# Patient Record
Sex: Male | Born: 1965 | Race: Asian | Hispanic: No | Marital: Married | Smoking: Former smoker
Health system: Southern US, Community
[De-identification: ages and names within clinical notes are randomized; demographics above are authoritative.]

## PROBLEM LIST (undated history)

## (undated) DIAGNOSIS — I739 Peripheral vascular disease, unspecified: Secondary | ICD-10-CM

## (undated) HISTORY — PX: OTHER SURGICAL HISTORY: SHX169

## (undated) HISTORY — DX: Peripheral vascular disease, unspecified: I73.9

---

## 2004-08-24 ENCOUNTER — Encounter: Admission: RE | Admit: 2004-08-24 | Discharge: 2004-08-24 | Payer: Self-pay | Admitting: Family Medicine

## 2004-12-02 ENCOUNTER — Encounter: Admission: RE | Admit: 2004-12-02 | Discharge: 2004-12-02 | Payer: Self-pay | Admitting: Family Medicine

## 2005-08-25 ENCOUNTER — Encounter: Admission: RE | Admit: 2005-08-25 | Discharge: 2005-08-25 | Payer: Self-pay | Admitting: Family Medicine

## 2005-10-19 ENCOUNTER — Encounter: Admission: RE | Admit: 2005-10-19 | Discharge: 2005-10-19 | Payer: Self-pay | Admitting: Interventional Radiology

## 2005-10-25 ENCOUNTER — Encounter: Admission: RE | Admit: 2005-10-25 | Discharge: 2005-10-25 | Payer: Self-pay | Admitting: Interventional Radiology

## 2005-11-29 ENCOUNTER — Encounter: Admission: RE | Admit: 2005-11-29 | Discharge: 2005-11-29 | Payer: Self-pay | Admitting: Interventional Radiology

## 2006-01-19 ENCOUNTER — Encounter: Admission: RE | Admit: 2006-01-19 | Discharge: 2006-01-19 | Payer: Self-pay | Admitting: Interventional Radiology

## 2006-01-24 HISTORY — PX: VARICOSE VEIN SURGERY: SHX832

## 2006-01-25 ENCOUNTER — Encounter: Admission: RE | Admit: 2006-01-25 | Discharge: 2006-01-25 | Payer: Self-pay | Admitting: Interventional Radiology

## 2006-02-23 ENCOUNTER — Encounter: Admission: RE | Admit: 2006-02-23 | Discharge: 2006-02-23 | Payer: Self-pay | Admitting: Interventional Radiology

## 2006-04-06 ENCOUNTER — Encounter: Admission: RE | Admit: 2006-04-06 | Discharge: 2006-04-06 | Payer: Self-pay | Admitting: Interventional Radiology

## 2006-07-12 IMAGING — US US MD EVAL AND MANAGEMENT - NRPT [facility]
1 series · 12 of 16 positions shown · non-contrast
Comparison: none

[Series 1: unknown · 12 of 41 slices shown]
[im 1/41]
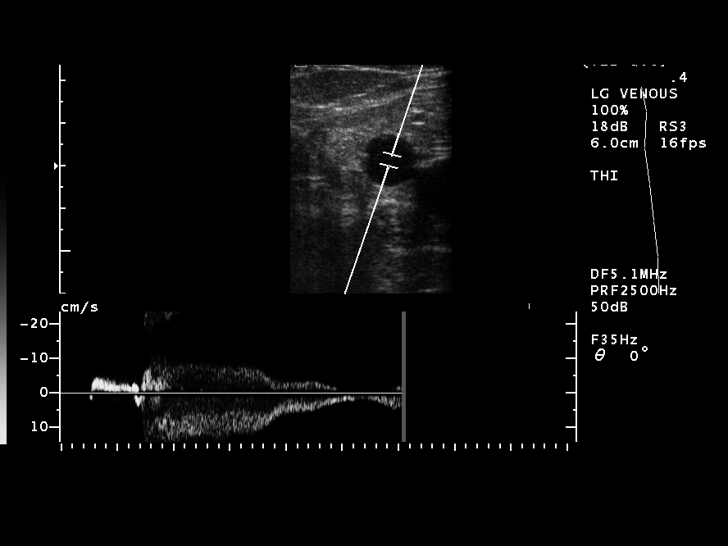
[im 6/41]
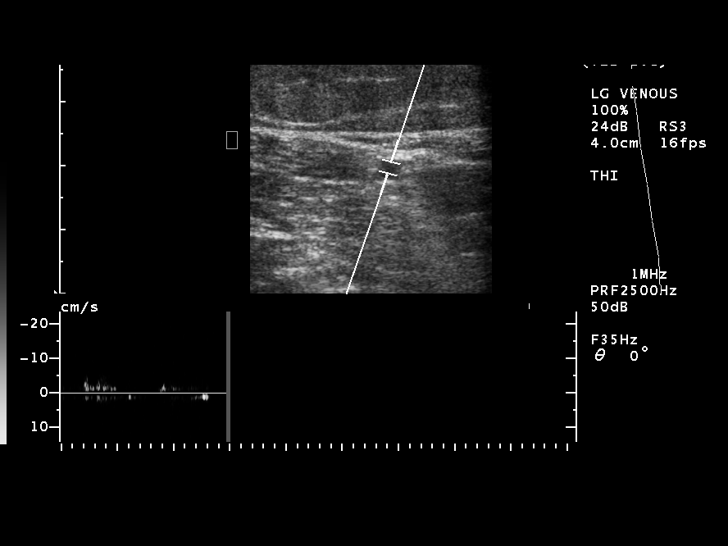
[im 9/41]
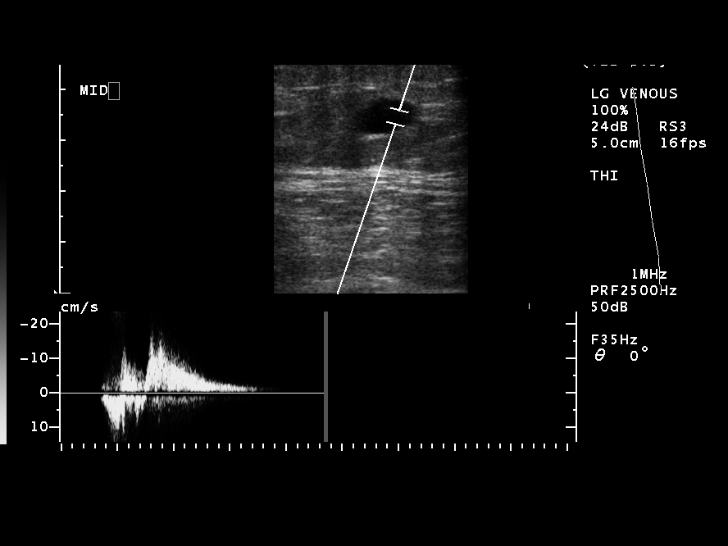
[im 11/41]
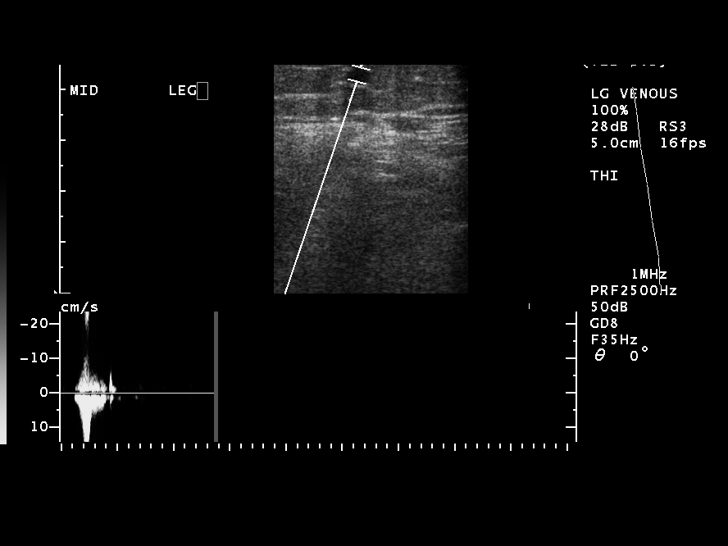
[im 17/41]
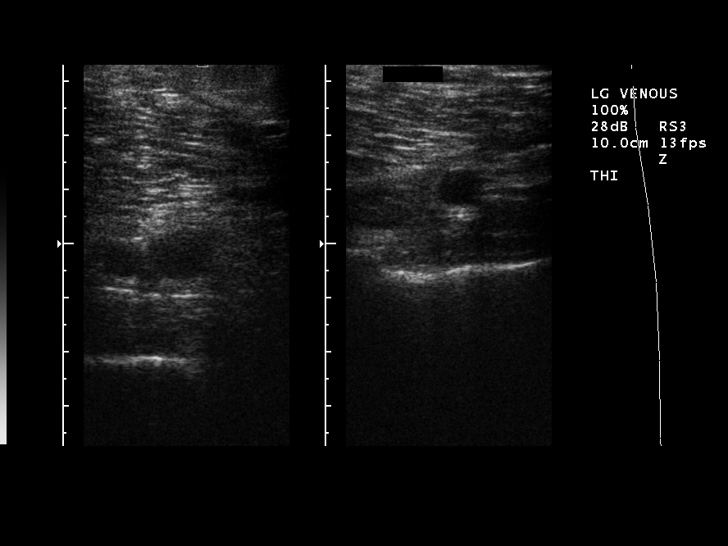
[im 19/41]
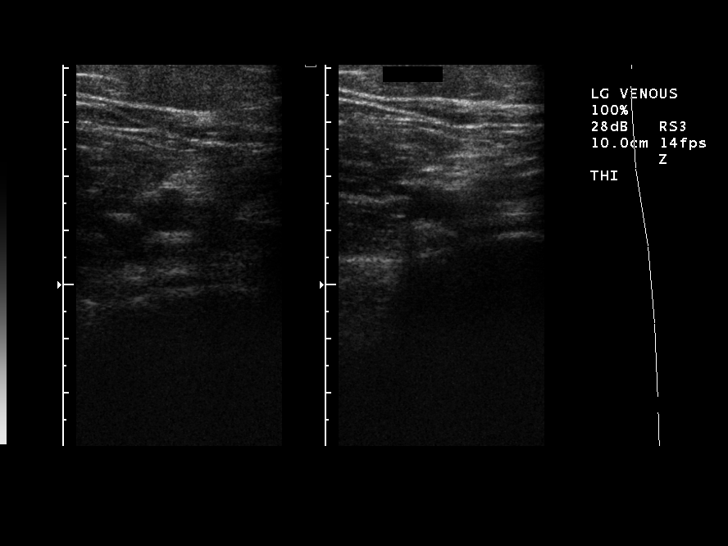
[im 22/41]
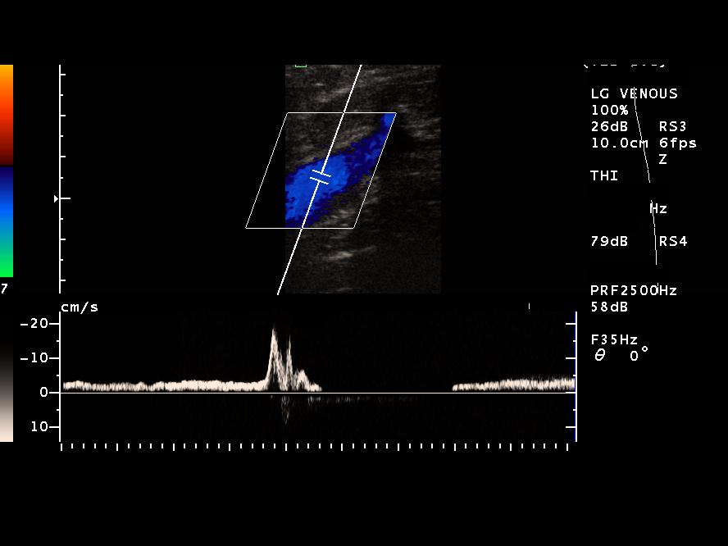
[im 27/41]
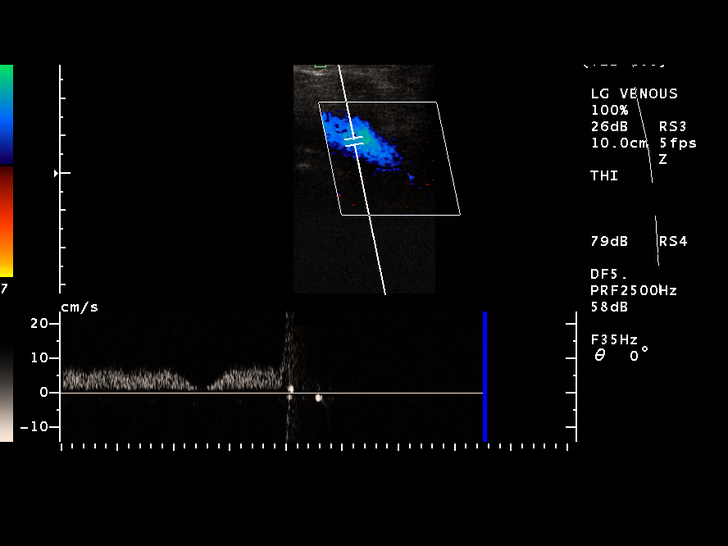
[im 30/41]
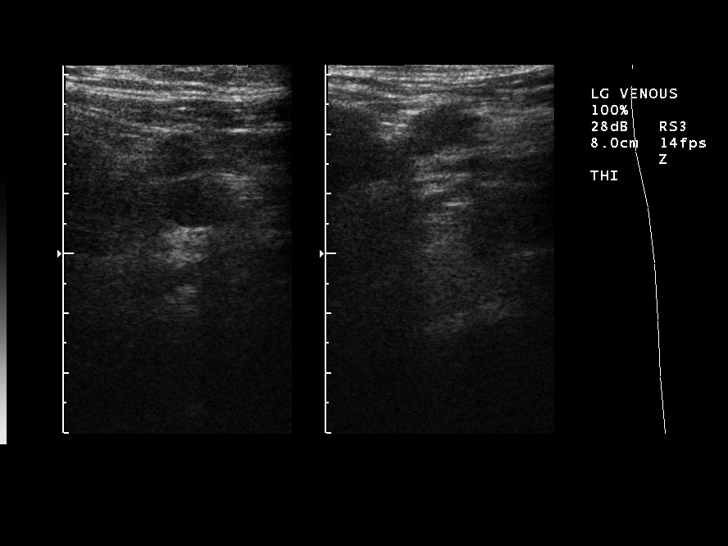
[im 33/41]
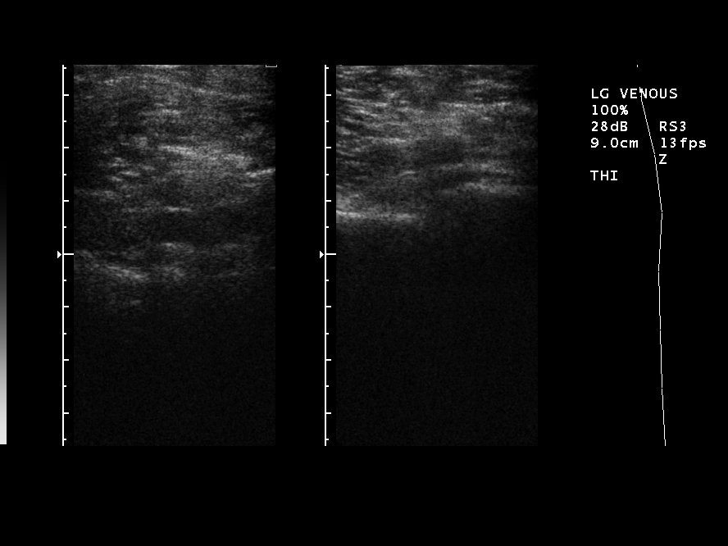
[im 38/41]
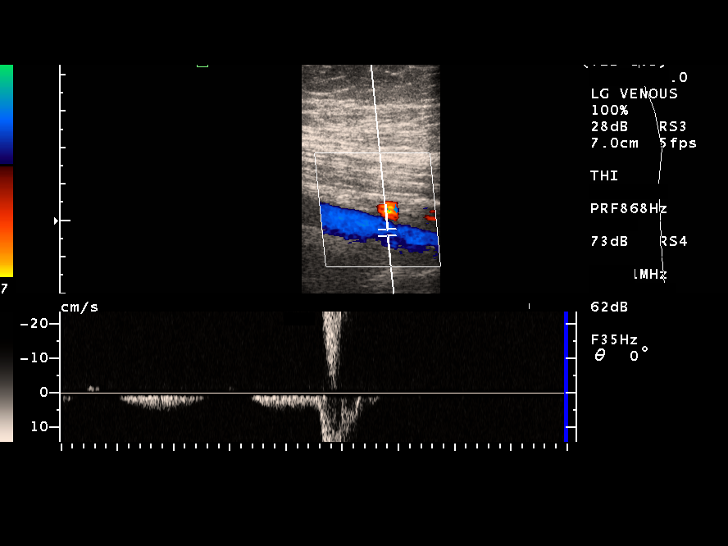
[im 41/41]
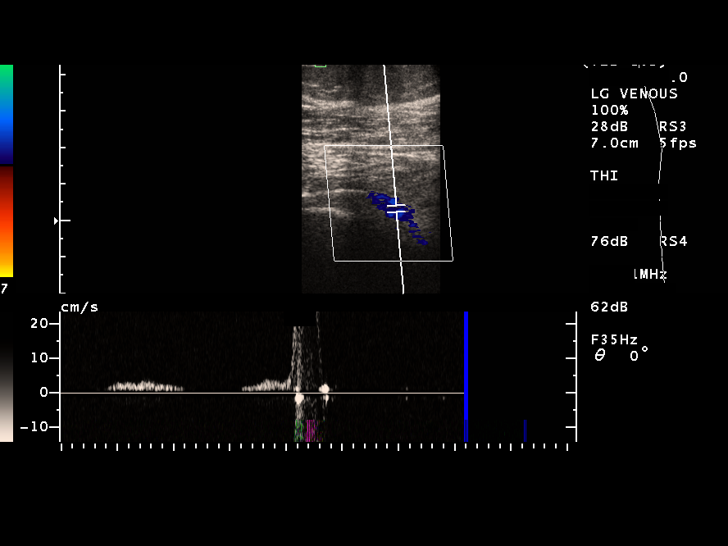

[12 of 16 positions shown; findings below may reference images not displayed]

OUTPATIENT CONSULTATION ? LEVEL III ? [DATE]
 Mauricia Jalali, M.D.
 [REDACTED] [REDACTED]
 [address]
 [HOSPITAL], [REDACTED].  [id number]
 RE:  Sravani Mohler (DOB ? [birth year])
 Dear Dr. Aujla:
 Thank you for referring Mr. Polak for varicose vein evaluation.  Review of his vein history was performed.  This is a pleasant 38 year old gentleman, who has fairly extensive lower extremity varicose veins, worse on the right.  He has had no prior treatments for varicose or spider veins.  He does have a family history of varicose veins which affected all of his siblings including one brother and two sisters.  There is no history of DVT or active thrombophlebitis.  The patient does report episodes of spontaneous bleeding from the varicose veins about the right ankle.  He also has had episodes of skin ulceration requiring treatment about the right upper ankle and calf region.  He currently does not wear prescription strength graded compression stockings.  He currently owns a gas station and works 10 to 12 hours a day.  He does stand for long periods of time during the day.  He describes a burning and throbbing type pain with achiness over the varicose veins involving both lower extremities.  He also has extensive pruritus over the varicosities with transient paresthesias and tingling sensations.  Elevation does partially alleviate his symptoms at the end of the day.  He also describes restless leg syndrome with difficulty falling asleep at night because of his lower extremity symptoms.  Despite his fairly extensive and advanced venous disease, he occasionally requires Tylenol or anti-inflammatories for pain relief. 
 On physical exam, the patient has fairly extensive right thigh and calf medial varicose veins which are large and tortuous. The veins are compressible.  No active thrombophlebitis.  There is +1 - 2 ankle edema.  Early hyperpigmentation and redness is noted of the anterior tibial region.  The ankles have ectatic reticular and spider veins diffusely extending onto the dorsum of the foot.  The left lower extremity has a large tortuous dominant posteromedial calf varicosity with +1 edema.  He has ankle spider and reticular veins which are prominent.  There are no thigh varicosities on the left extremity. 
 Ultrasound was performed of the lower extremities today.  This was dictated as a separate report. Briefly, the patient has extensive bilateral GSV venous insufficiency/reflux with incompetent saphenofemoral junctions. The small saphenous veins are normal.  There is no evidence of DVT.  
 The patient has a normal peripheral pulse exam with palpable +2 DP and PT pulses.
 Today, I spent approximately 45 minutes with Mr. Polak and in detail described the physical exam and ultrasound findings.  Our discussion centered around staged bilateral endovenous laser treatment of the abnormal great saphenous veins resulting in his symptomatic large painful varicosities.  The goals with treating his legs would be to prevent progression of disease and decompress the varicose veins to eliminate his symptoms of burning and throbbing pain, pruritus, and paresthesias.  Also, he has already had one episode of skin break down with ulceration and one episode of spontaneous bleeding.  
 Today, he was fitted for 20 to 30 mm Hg prescription strength graded compression stockings, thigh length and encouraged to begin an exercise program for weight loss.  He does wish to proceed with insurance preauthorization which we will perform for him.  
 Again, thank you for allowing me to participate in Mr. Polak?Dammert care.  I will keep you informed of his treatment plan and progress.  
 Lala, Msw, M.D.
 MTS:chc

## 2006-07-25 ENCOUNTER — Encounter: Admission: RE | Admit: 2006-07-25 | Discharge: 2006-07-25 | Payer: Self-pay | Admitting: Interventional Radiology

## 2007-12-13 IMAGING — US US EXTREM LOW VENOUS*L*
1 series · 14 of 24 positions shown · non-contrast
Comparison: none

CLINICAL DATA: 40-year-old male, 1 week status-post left GSV transcatheter laser occlusion and left calf varicose vein phlebectomy. 
LEFT LOWER EXTREMITY VENOUS DOPPLER ULTRASOUND:
TECHNIQUE: Gray-scale sonography with compression, as well as color and duplex Doppler ultrasound, were performed to evaluate the deep venous system from the level of the common femoral vein through the popliteal and proximal calf veins.

[Series 1: unknown · 14 of 31 slices shown]
[im 1/31]
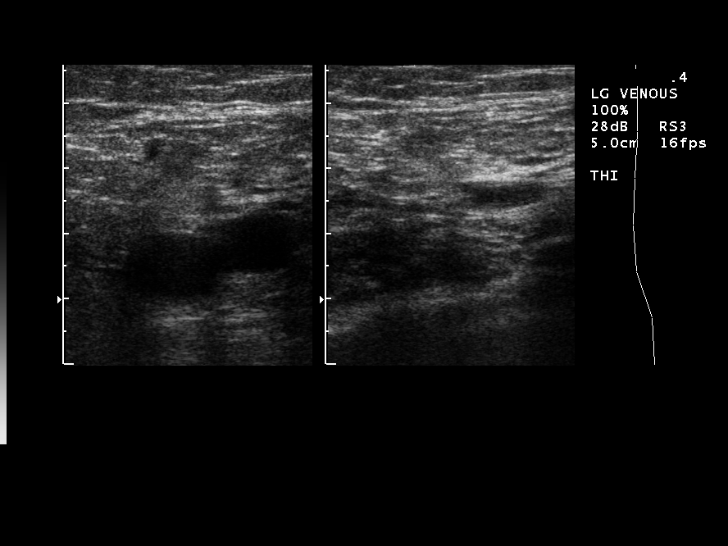
[im 3/31]
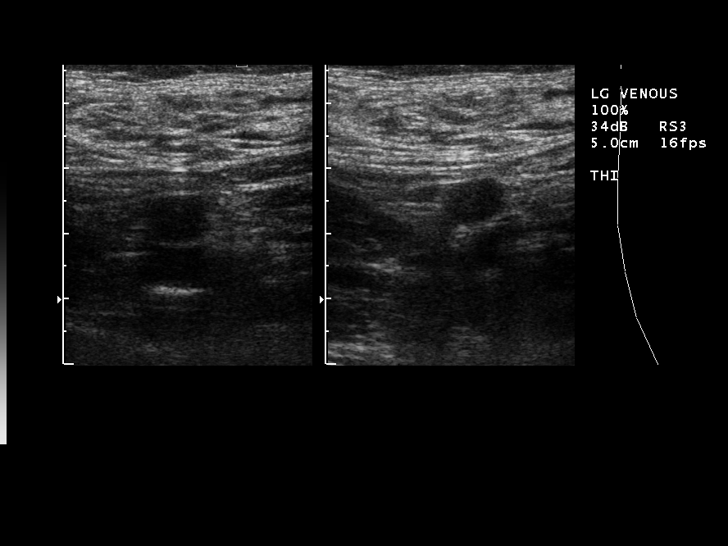
[im 6/31]
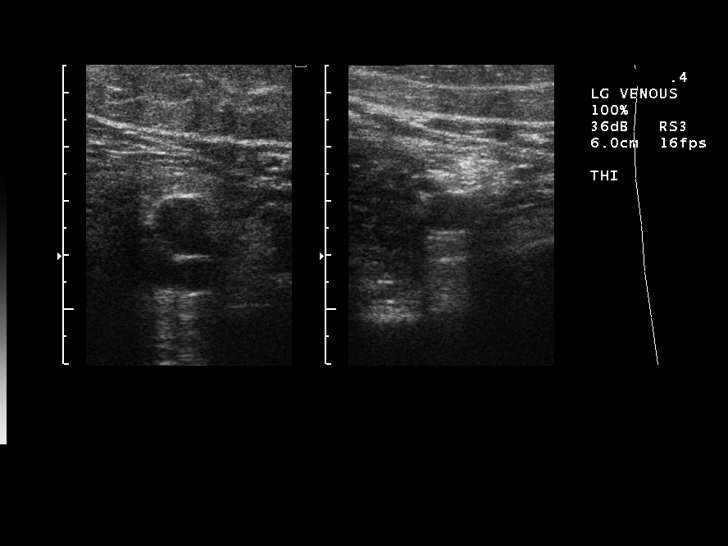
[im 8/31]
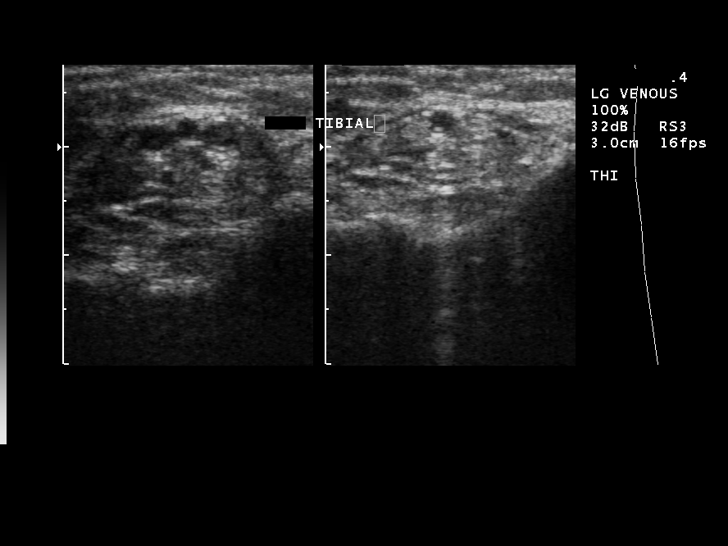
[im 10/31]
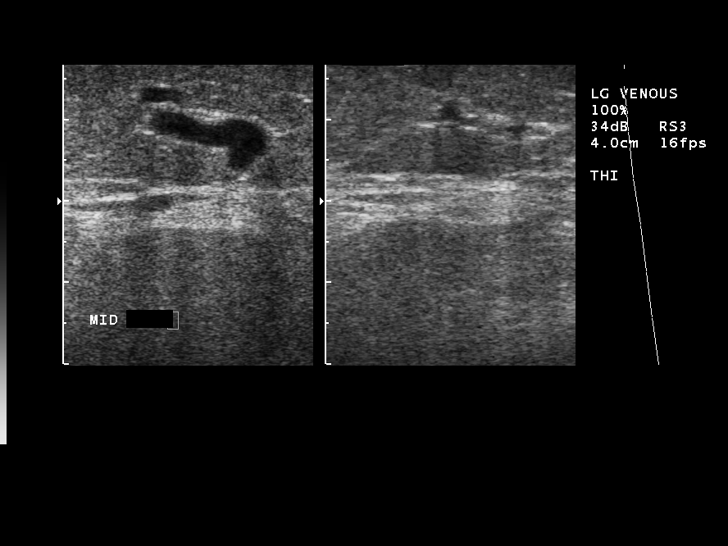
[im 12/31]
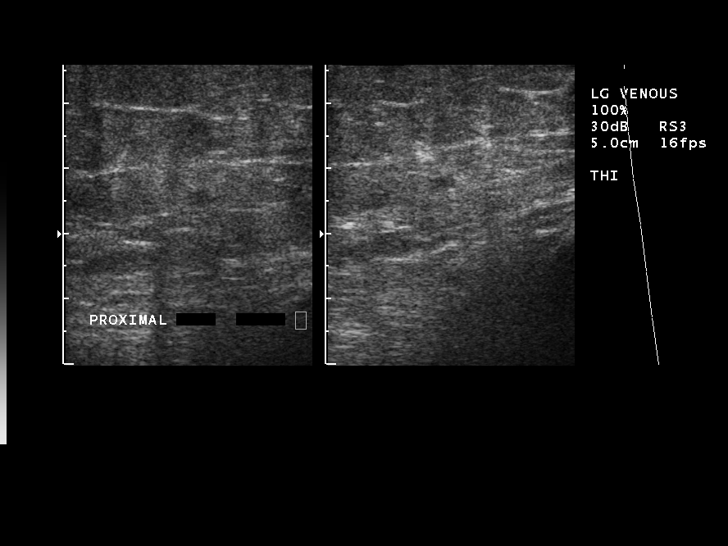
[im 15/31]
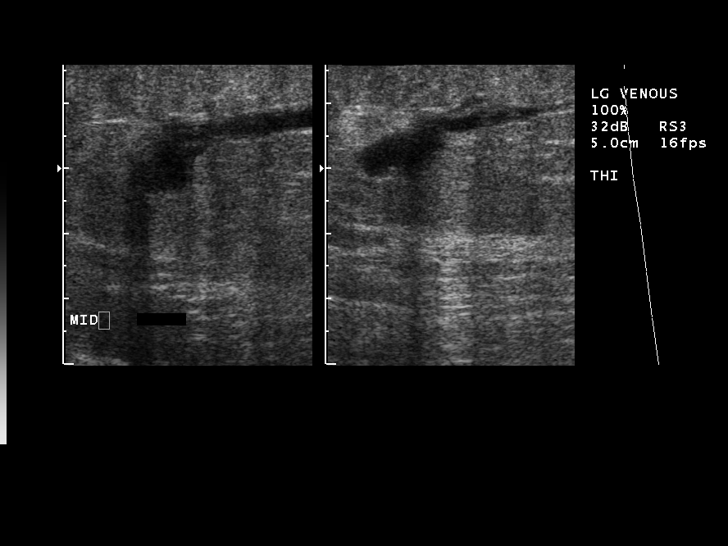
[im 16/31]
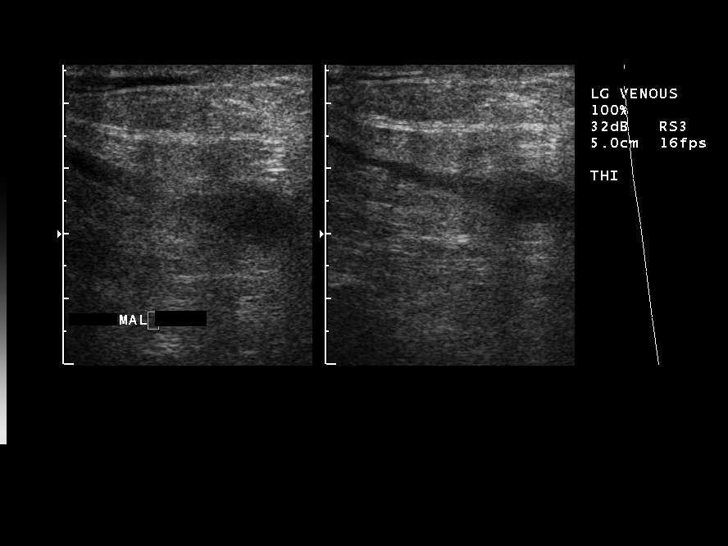
[im 19/31]
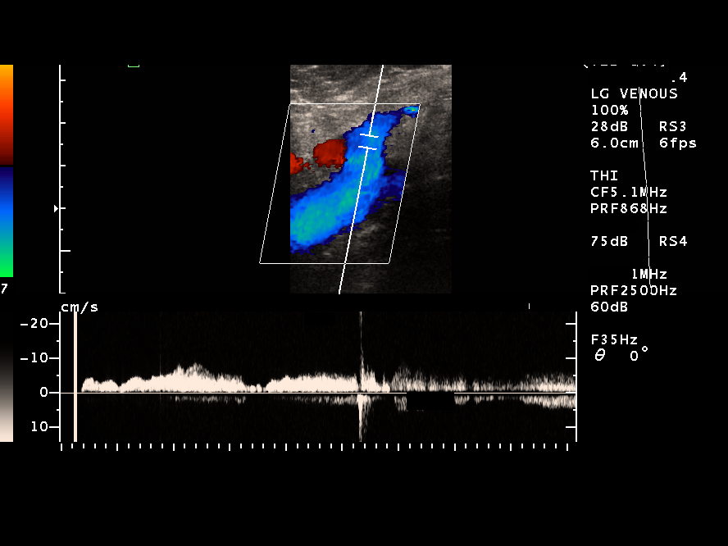
[im 21/31]
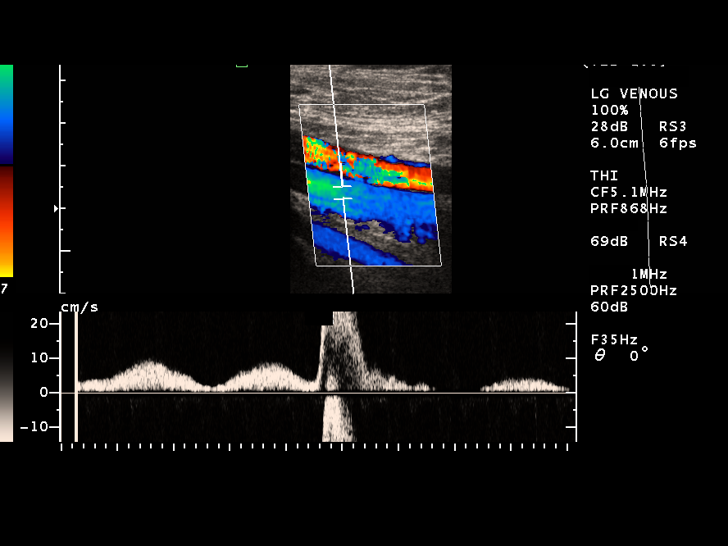
[im 24/31]
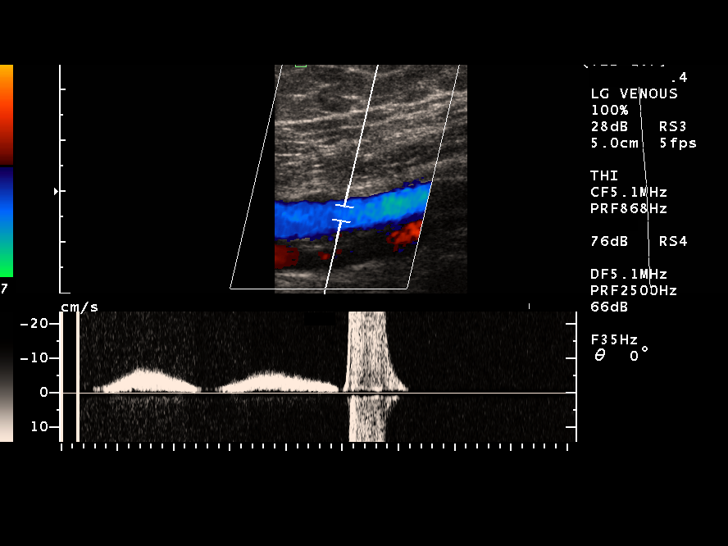
[im 25/31]
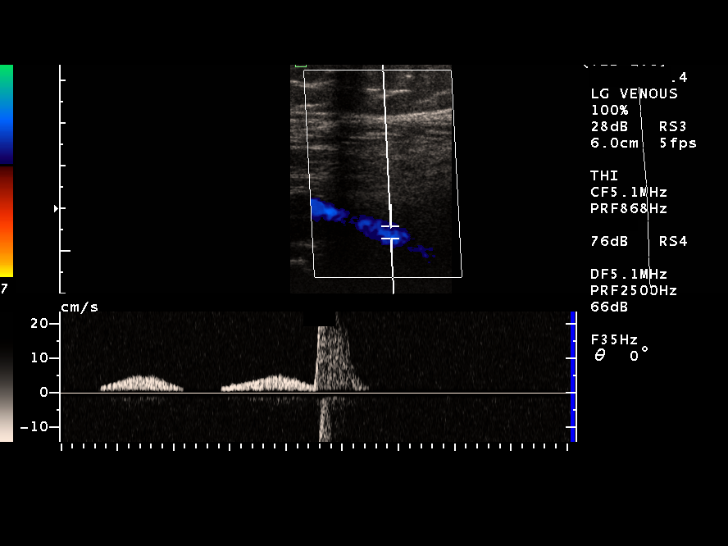
[im 28/31]
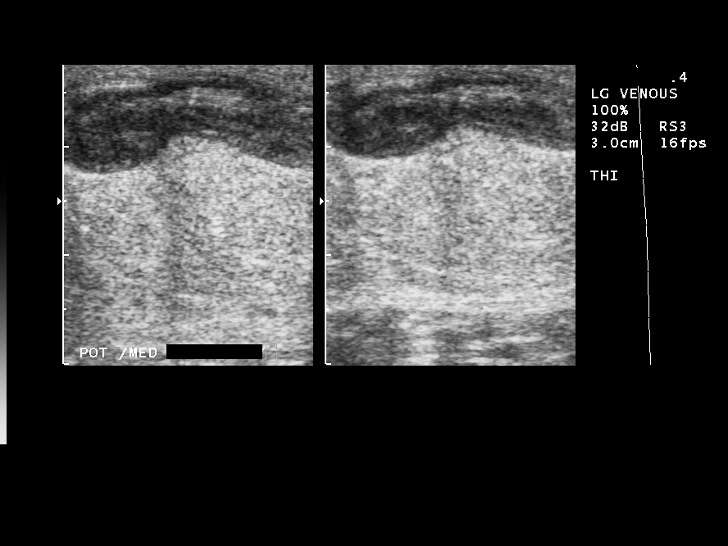
[im 31/31]
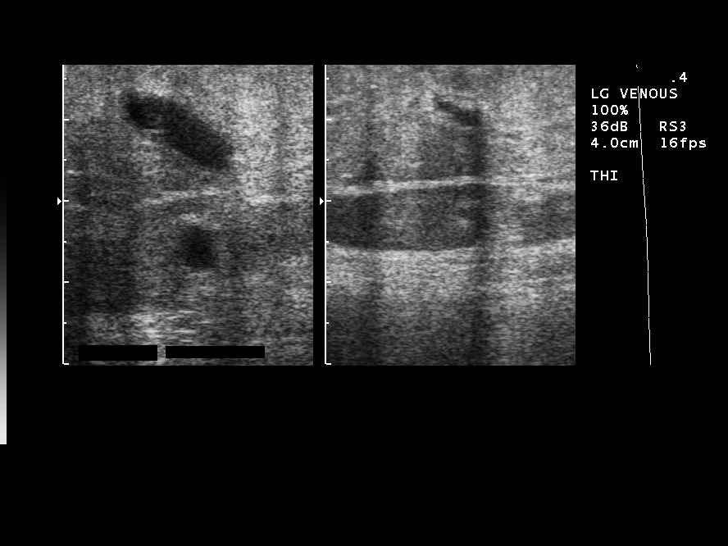

[14 of 24 positions shown; findings below may reference images not displayed]

FINDINGS: Left common femoral, femoral, and popliteal veins demonstrate normal compressibility and augmentation without DVT.  The treated segment of the left GSV remains thrombosed.  No early recanalization or delayed complication.  Calf varicosities are also thrombosed.  Overlying skin intact.  Incision site is healing.
IMPRESSION: 1.  No evidence of DVT.
2.  Treated left GSV segment remains occluded. 
3.  Calf varicosities are thrombosed following phlebectomy.

## 2008-01-11 IMAGING — US US MFM FOLLOW-UP FOCUS VISIT
1 series · 13 of 24 positions shown · non-contrast
Comparison: none

CLINICAL DATA: One month follow-up endovenous laser treatment of the left great saphenous vein. 
 ULTRASOUND FOLLOW-UP FOCUSED VISIT, 02/23/06:
 The patient returns today one month following endovenous laser treatment of the left great saphenous vein.   The patient is doing well clinically.  The patient states that he remains tender over the treated segment of left great saphenous vein.  The patient is somewhat concerned of continued varicose veins in the posterior calf in the area of phlebectomy. 
 On physical exam, there are visible varicosities in the posterior knee and upper calf region.  The patient states this was in the area of phlebotomy following the laser treatment.  No areas of redness or other unexpected abnormality.
 The patient underwent left lower extremity venous ultrasound which was dictated in a separate report.  In short, this shows complete occlusion of the left great saphenous vein from the saphenofemoral junction to the entry point in the mid calf.  There are partially thrombosed varicose veins within the posterior calf region. 
 I instructed the patient to continue to wear his support stockings as tolerated to attempt to fully thrombose these varicose veins in the proximal calf.  If these have not thrombosed in a month or two, the patient may call for us to perform foam sclerotherapy on these.  We will also see the patient back for a six month follow-up visit.  
 I spent approximately 10 minutes in direct consultation with the patient.

[Series 1: us mfm follow-up focus visit · 13 of 24 slices shown]
[im 1/24]
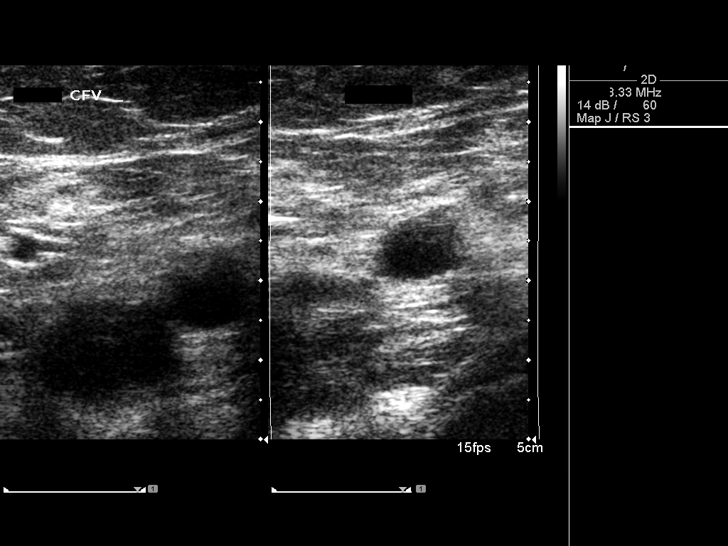
[im 3/24]
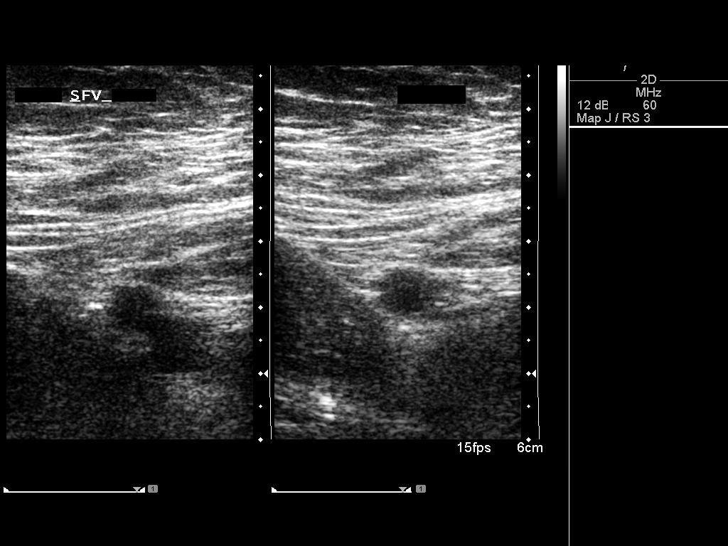
[im 5/24]
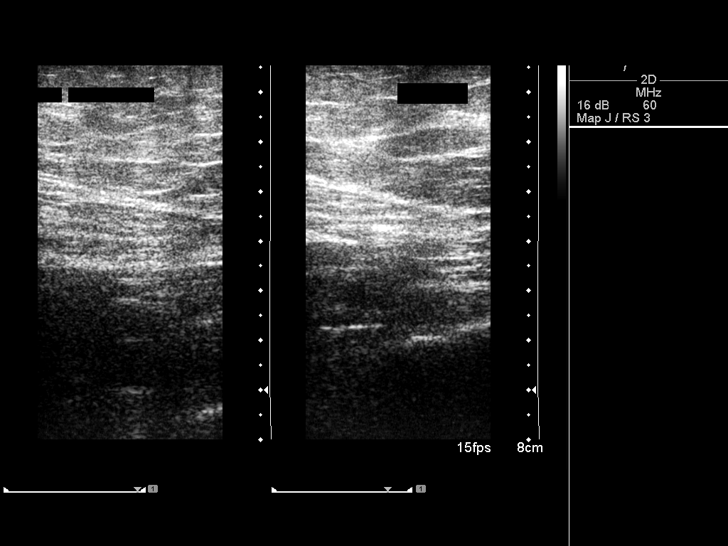
[im 7/24]
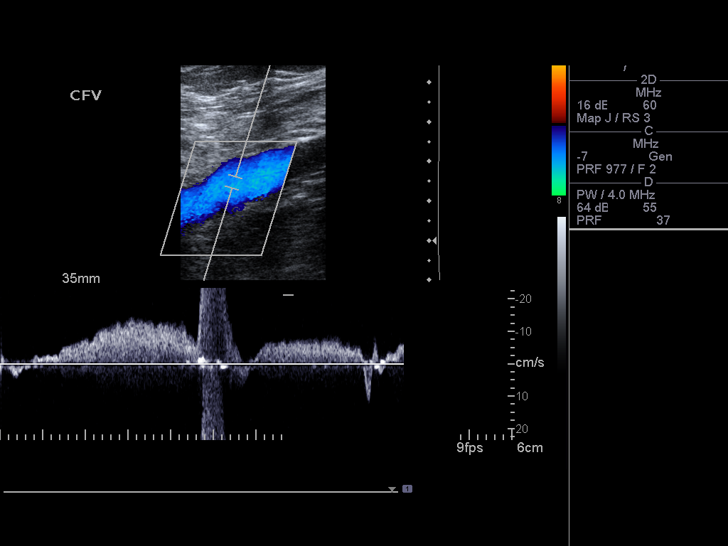
[im 9/24]
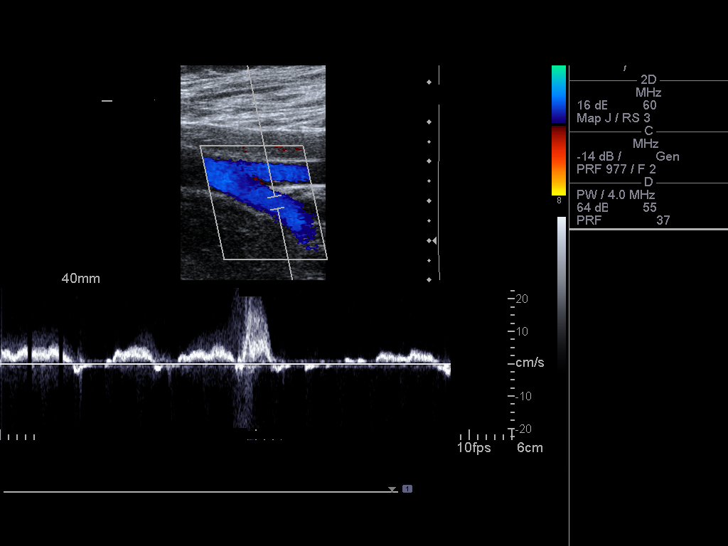
[im 11/24]
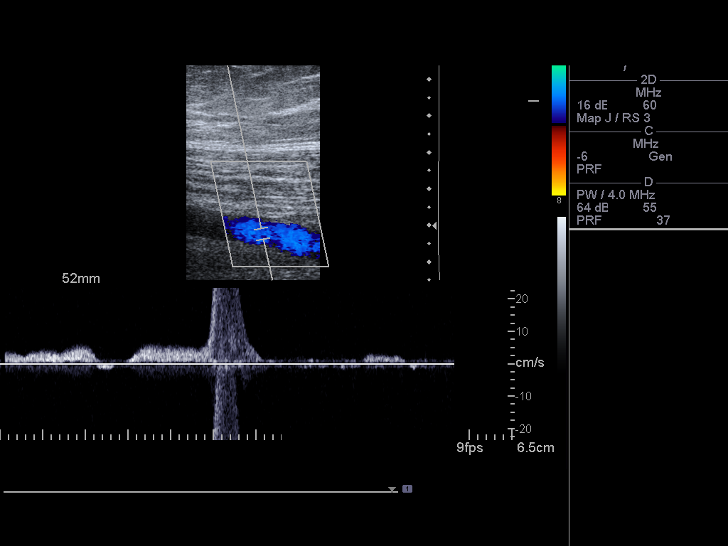
[im 13/24]
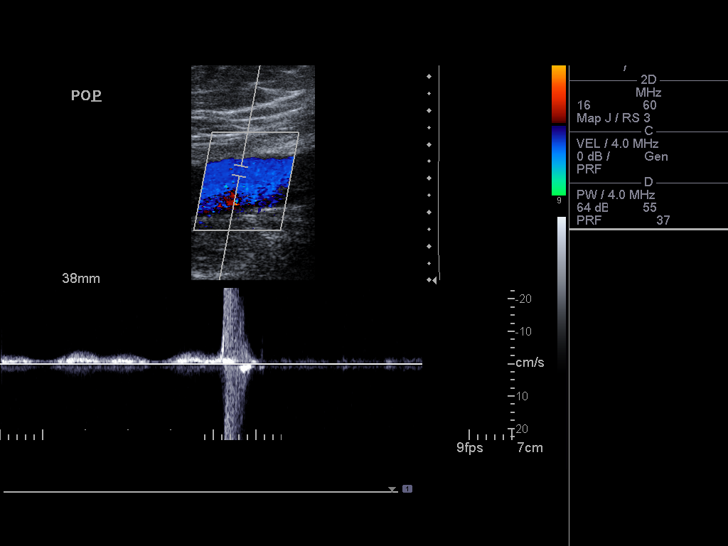
[im 14/24]
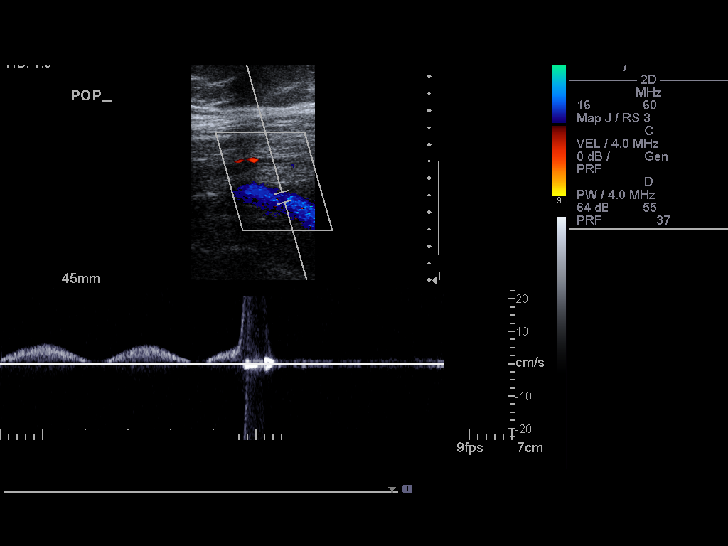
[im 16/24]
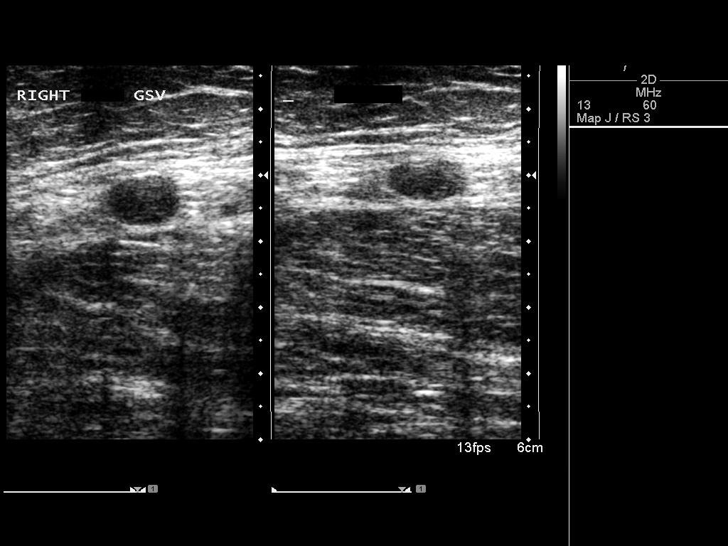
[im 18/24]
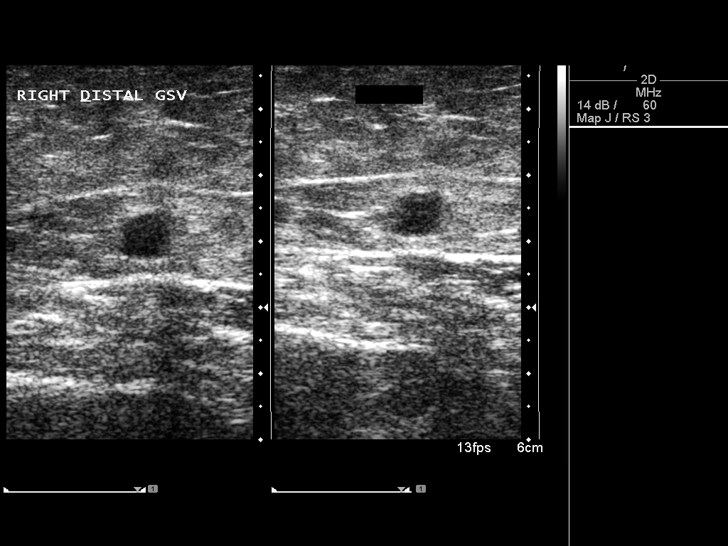
[im 20/24]
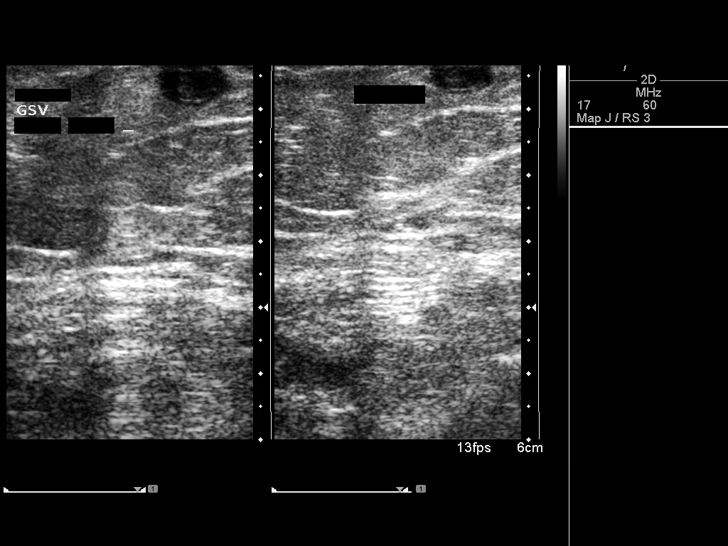
[im 22/24]
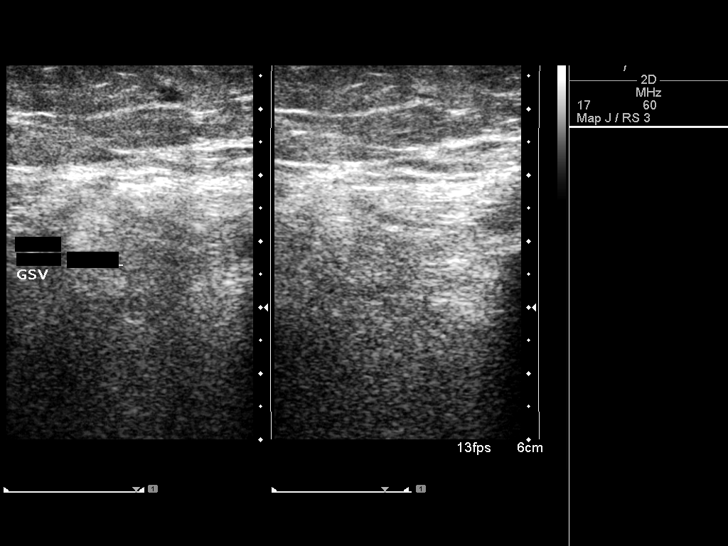
[im 24/24]
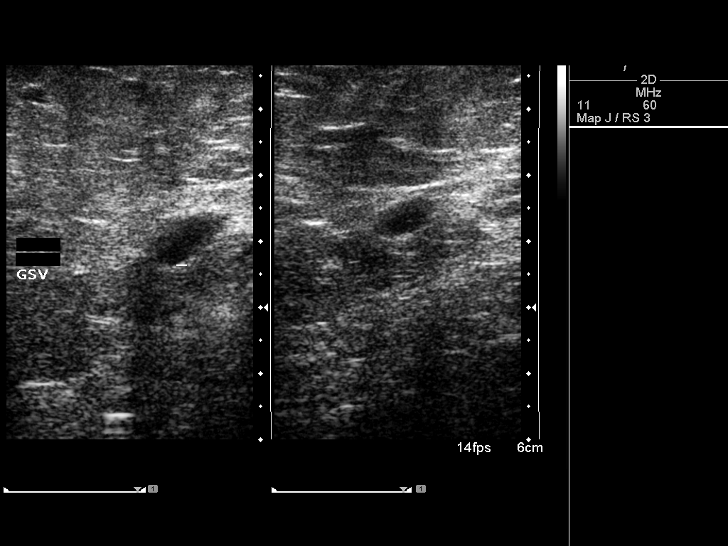

[13 of 24 positions shown; findings below may reference images not displayed]

## 2008-06-11 IMAGING — US US MFM FOLLOW-UP FOCUS VISIT
1 series · 13 of 28 positions shown · non-contrast
Comparison: none

CLINICAL DATA: 40-year-old male six months status post left GSV transcatheter laser occlusion and varicose vein phlebectomy. 
ESTABLISHED PATIENT OFFICE VISIT, LEVEL II - [id number]:
Interval History:  Mr. Barco underwent left GSV transcatheter laser occlusion and left calf varicose vein phlebectomy, 01/19/06.  He has recovered from the procedure over the last six months very well.  He currently complains of small recurring varicosities in the median calf region.  He has been noncompliant with daily exercise except walking in the evenings approximately four times a week.  He currently is not wearing the compression stockings daily.
Focused Exam:  Skin is intact.  Small subsurface compressible varicosities are noted which are nontender in the calf region.  Phlebectomy sites are well healed.  No active phlebitis or cellulitis.
Ultrasound:  This demonstrates partial recanalization of the left GSV treated segment as well as partial thrombosis of the calf varicosities.  This is unchanged compared to 01/25/06.
Assessment:  Six months status post left GSV transcatheter laser occlusion and phlebectomy of calf varicosities.  Treated left GSV segment has partially recanalized.  Calf varicosities are also partially thrombosed.  No current DVT.  He is experiencing mild recurrent bilateral leg pain.
Plan:  The patient was encouraged at this time to continue with a conservative approach. The recanalized left GSV is not dilated enough to warrant retreatment with the laser.  He remains morbidly obese.  He was encouraged to commit to a daily exercise program and diet.  He was also given a prescription for new compression stockings and encouraged to elevate his legs after exercise nightly.  His pain is currently controlled with Advil and Tylenol.  If his symptoms progress despite increased exercise and hopefully some weigh loss, he can be reevaluated on an as needed basis for possible retreatment in the future.

[Series 1: us mfm follow-up focus visit · 13 of 30 slices shown]
[im 2/30]
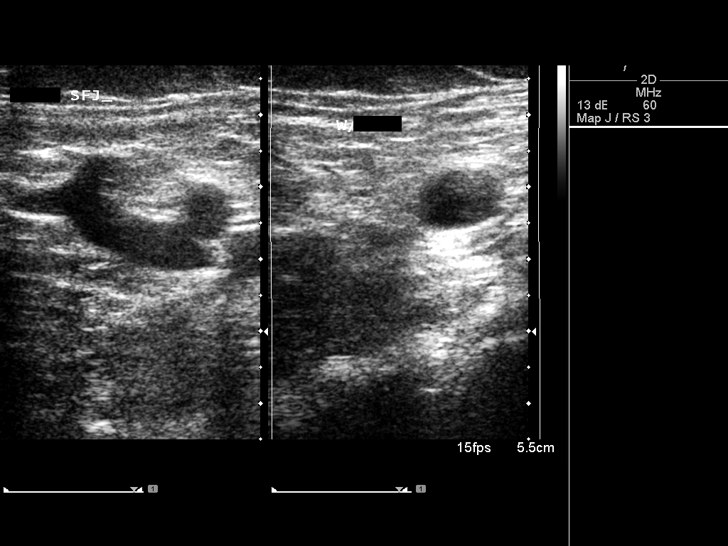
[im 4/30]
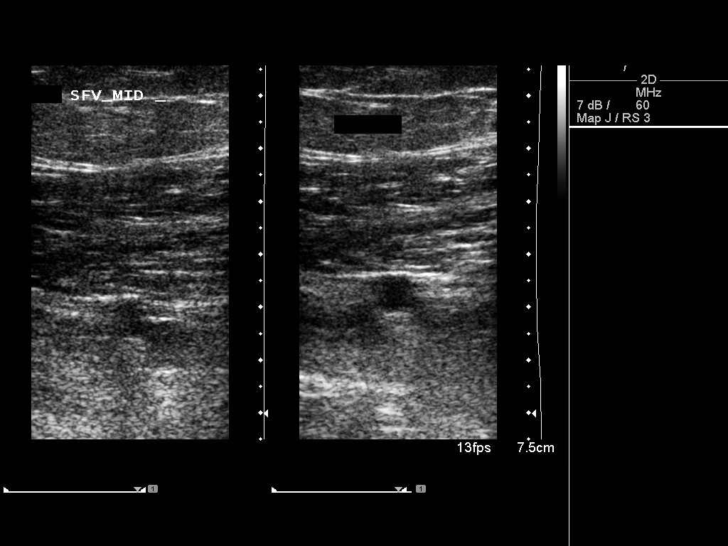
[im 6/30]
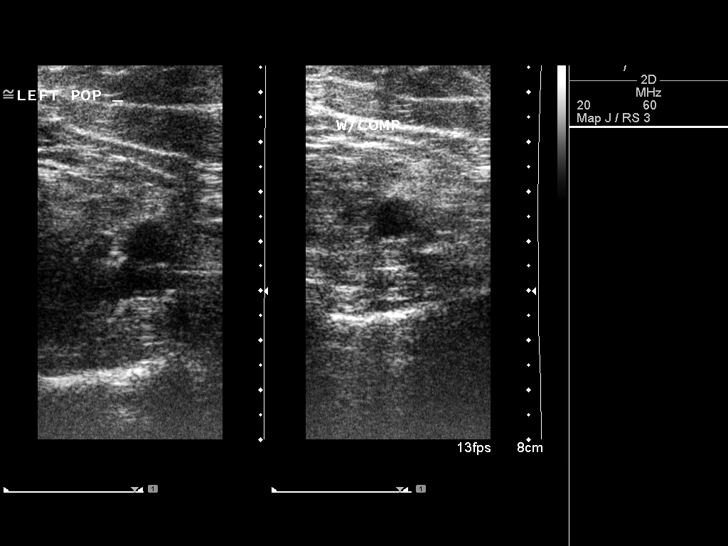
[im 8/30]
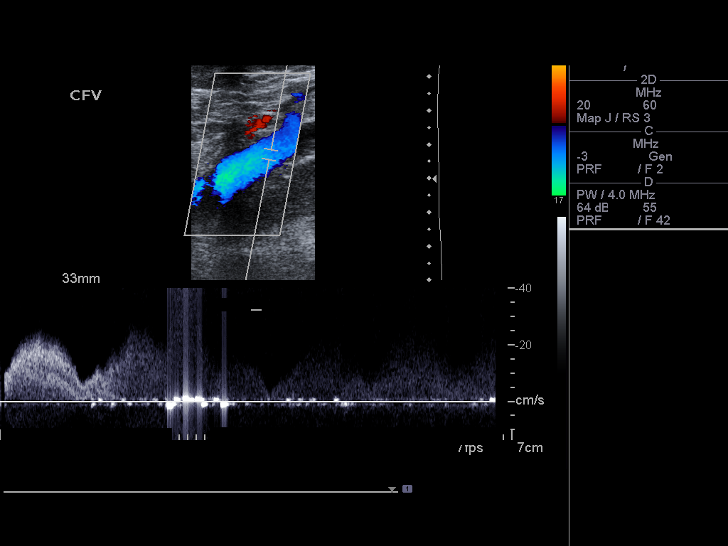
[im 10/30]
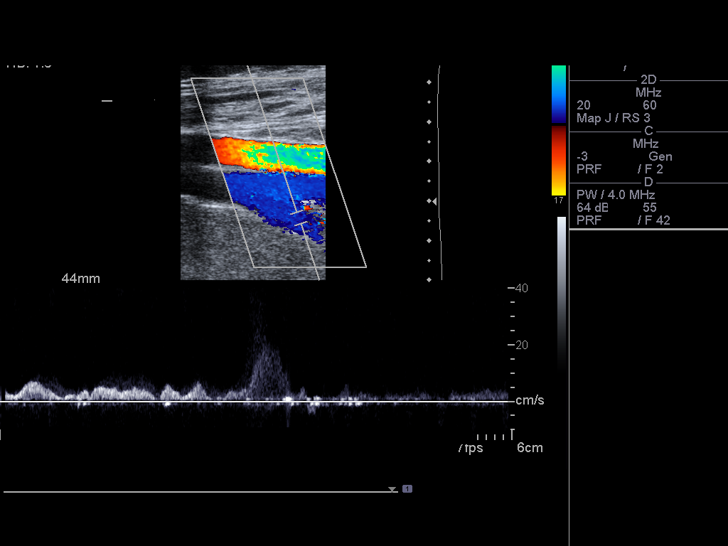
[im 12/30]
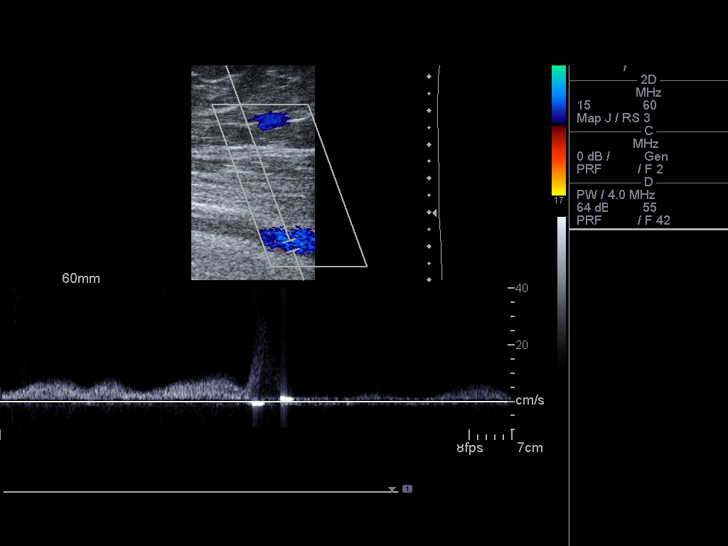
[im 16/30]
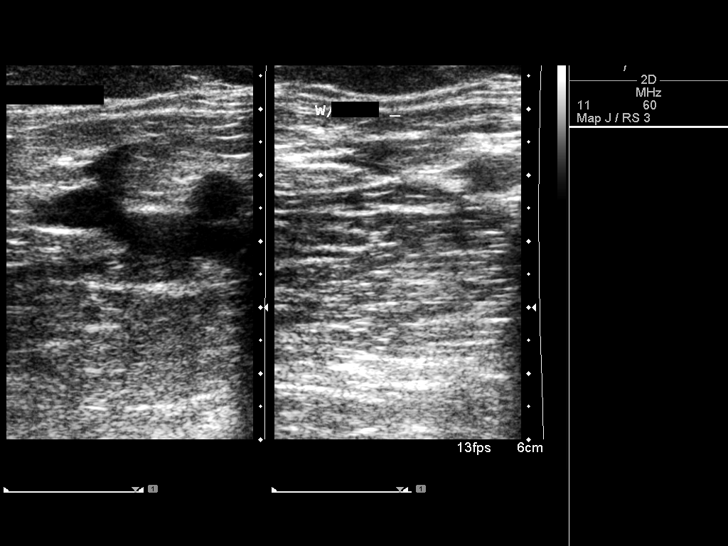
[im 18/30]
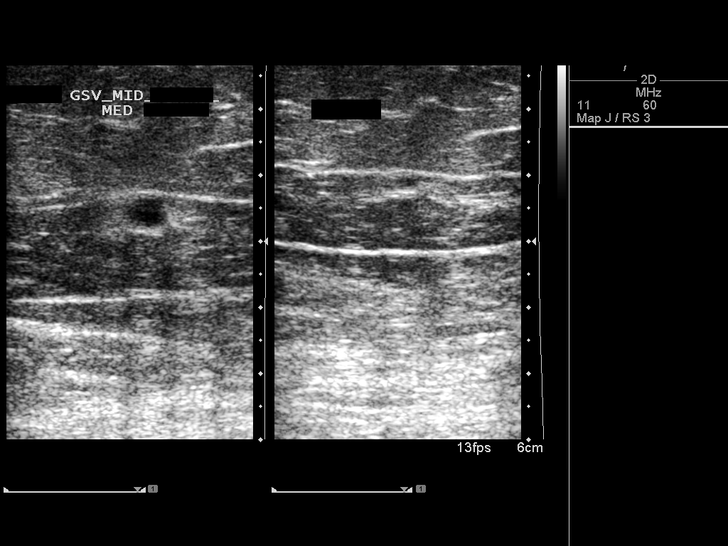
[im 20/30]
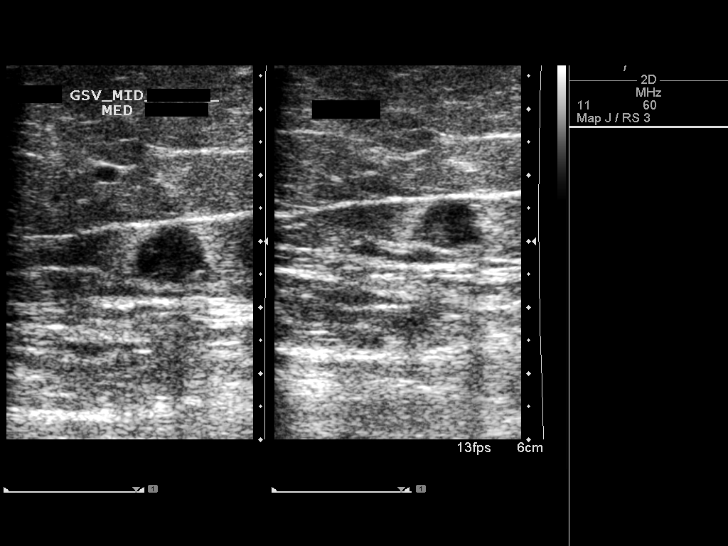
[im 22/30]
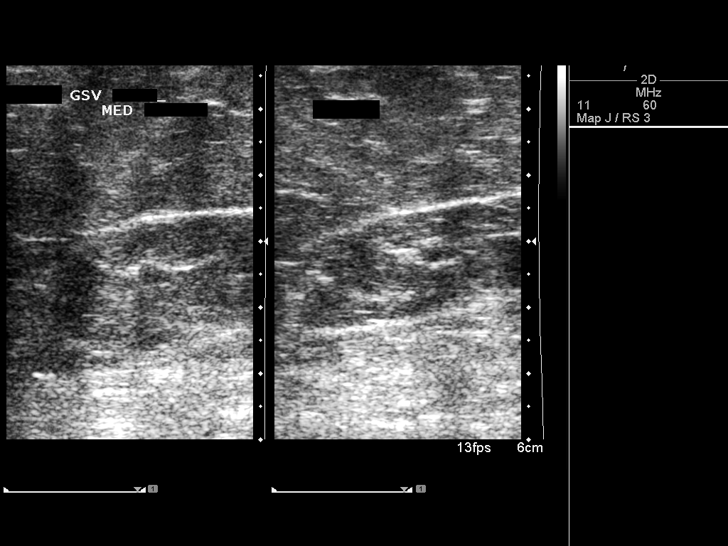
[im 24/30]
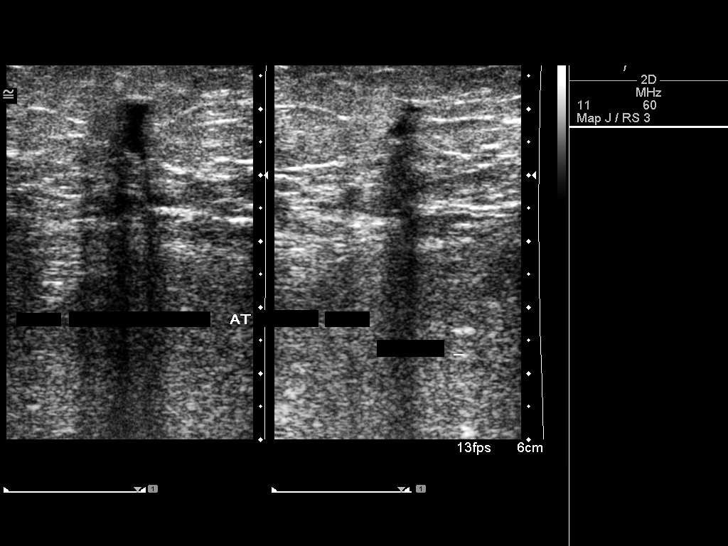
[im 26/30]
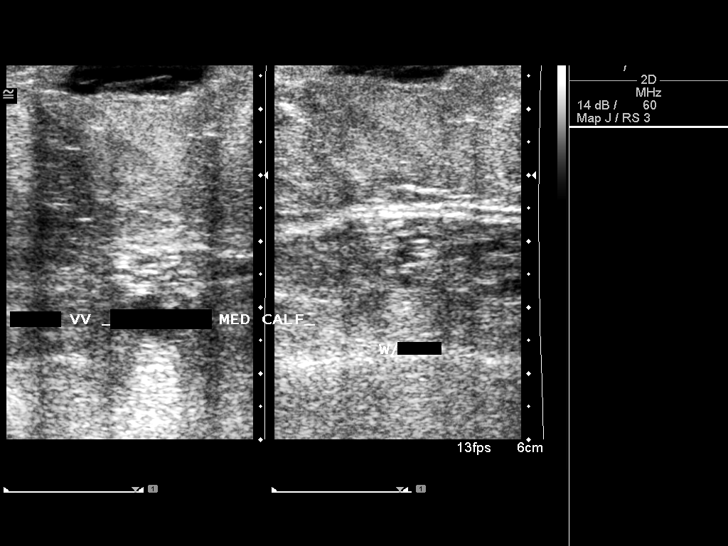
[im 28/30]
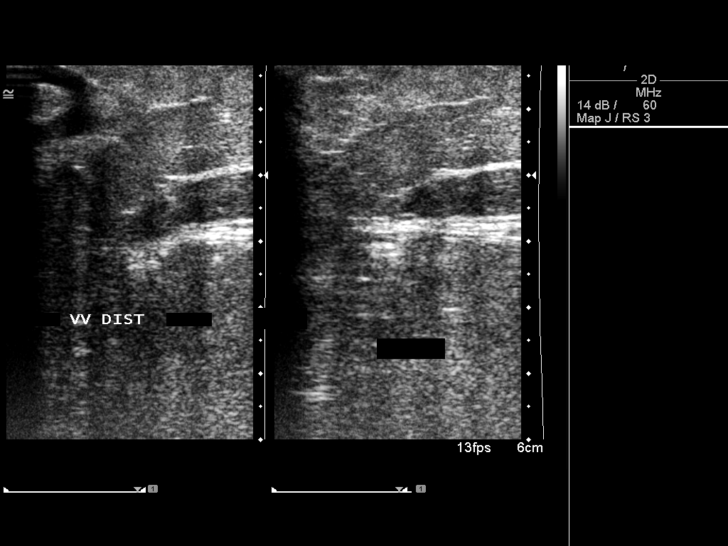

[13 of 28 positions shown; findings below may reference images not displayed]

## 2008-07-30 ENCOUNTER — Ambulatory Visit: Payer: Self-pay | Admitting: Vascular Surgery

## 2010-02-13 ENCOUNTER — Encounter: Payer: Self-pay | Admitting: Interventional Radiology

## 2010-04-26 ENCOUNTER — Ambulatory Visit (HOSPITAL_COMMUNITY): Payer: BC Managed Care – PPO | Attending: Orthopedic Surgery

## 2010-04-26 ENCOUNTER — Ambulatory Visit (HOSPITAL_BASED_OUTPATIENT_CLINIC_OR_DEPARTMENT_OTHER)
Admission: RE | Admit: 2010-04-26 | Discharge: 2010-04-26 | Disposition: A | Discharge status: Home / Self Care | Payer: BC Managed Care – PPO | Source: Ambulatory Visit | Attending: Orthopedic Surgery | Admitting: Orthopedic Surgery

## 2010-04-26 DIAGNOSIS — M7730 Calcaneal spur, unspecified foot: Secondary | ICD-10-CM | POA: Insufficient documentation

## 2010-04-26 DIAGNOSIS — M6520 Calcific tendinitis, unspecified site: Secondary | ICD-10-CM | POA: Insufficient documentation

## 2010-04-26 DIAGNOSIS — E669 Obesity, unspecified: Secondary | ICD-10-CM | POA: Insufficient documentation

## 2010-04-26 DIAGNOSIS — Z01812 Encounter for preprocedural laboratory examination: Secondary | ICD-10-CM | POA: Insufficient documentation

## 2010-04-26 LAB — POCT HEMOGLOBIN-HEMACUE: Hemoglobin: 14.6 g/dL (ref 13.0–17.0)

## 2010-05-12 NOTE — Op Note (Addendum)
NAMEHUSSAIN, Richard Mercer                 ACCOUNT NO.:  [id number]  MEDICAL RECORD NO.:  [id number]            PATIENT TYPE:  LOCATION:                                 FACILITY:  PHYSICIAN:  Marlowe Kays, M.D.  DATE OF BIRTH:  1965/02/04  DATE OF PROCEDURE:  04/26/2010 DATE OF DISCHARGE:  04/26/2010                              OPERATIVE REPORT   PREOPERATIVE DIAGNOSES: 1. Painful posterior right os calcis spur. 2. Extensive calcification of Achilles tendon, right.  POSTOPERATIVE DIAGNOSES: 1. Painful posterior right os calcis spur. 2. Extensive calcification of Achilles tendon, right.  OPERATION: 1. Excision of posterior os calcis spur. 2. Excision of calcification/ossification of Achilles tendon with     repair of Achilles tendon using a Stryker force stranded rotator     cuff anchor.  SURGEON:  Marlowe Kays, MD  ASSISTANT:  Nurse.  ANESTHESIA:  General.  PLAN AND JUSTIFICATION FOR PROCEDURES:  He has had progressive pain and swelling at posterior right ankle with x-rays demonstrating the preoperative problems.  He is having trouble walking and that he is here today for the above-mentioned surgery.  DESCRIPTION OF PROCEDURE:  Under satisfactory general anesthesia, pneumatic tourniquet applied with right leg Esmarched out non-sterilely and right leg prepped with DuraPrep from midcalf to toes and draped in sterile field.  Time-out performed.  Using a C-arm, I marked out the areas of the ossification in the Achilles tendon and also the posterior heel spur and then made a curved posterior medial incision encompassing these areas.  Working first distally with subperiosteal dissection, I demarcated the most inferior portion of the spurs had it partially detach the Achilles tendon.  With osteotome, rongeur, and curette, I removed the bone at this location.  I then worked more cephalad, and again using the C-arm, removed additional spurring and some of which was in the  Achilles tendon and required essentially, almost complete detachment of the Achilles tendon.  Lastly, I found the __________ calcium/ossification in the medial portion of the Achilles tendon, which I excised with sharp dissection, measured about 3 cm in length and 1.3 cm in width.  I repaired the Achilles tendon with interrupted 2-0 Vicryl.  Final lateral x-ray was taken documenting removal of all the above problems.  The wound was irrigated with sterile saline.  Soft tissue was infiltrated with 0.5% plain Marcaine.  I then used a four- strand 5.5 mm Stryker rotator cuff anchor with a predrill to his heel bringing down the Achilles tendon snugly.  I supplemented this with some additional suture of the strands.  The wound was then irrigated with sterile saline.  Subcutaneous tissue was closed with 2-0 Vicryl and skin with interrupted 4-0 nylon mattress sutures.  Betadine, Adaptic, dry sterile dressing were applied with a well-padded short-leg splint cast.  Tourniquet was released.  He tolerated the procedure well.  At time of this dictation, he was on his way to recovery room in satisfactory condition with no known complications.          ______________________________ Marlowe Kays, M.D.     JA/MEDQ  D:  04/26/2010  T:  04/27/2010  Job:  [id number]  Electronically Signed by Marlowe Kays M.D. on 05/27/2010 12:34:43 PM

## 2010-06-08 NOTE — Procedures (Signed)
LOWER EXTREMITY VENOUS REFLUX EXAM   INDICATION:  Bilateral lower extremity painful varicose veins with  previous bilateral laser ablations.   EXAM:  Using color-flow imaging and pulse Doppler spectral analysis, the  right and left common femoral, superficial femoral, popliteal, posterior  tibial, greater and lesser saphenous veins are evaluated.  There is no  evidence suggesting deep venous insufficiency in the right and left  lower extremities.   The right and left saphenofemoral junctions are not competent.  The  right and left GSV are not competent with the caliber as described  below.   The right and left proximal short saphenous veins appear within normal  limits, however, were difficult to image well.   GSV Diameter (used if found to be incompetent only)                                            Right    Left  Proximal Greater Saphenous Vein           0.8 cm   0.9 cm  Proximal-to-mid-thigh                     cm       cm  Mid thigh                                 0.8 cm   0.86 cm  Mid-distal thigh                          cm       cm  Distal thigh                              0.5 cm   cm  Knee                                      0.44 cm  cm   IMPRESSION:  1. Right greater saphenous vein reflux is identified with the caliber      ranging from 0.44 cm to 0.8 cm knee to groin.  The left from 0.86      cm to 0.9 cm.  2. The right and left greater saphenous veins are not aneurysmal.  3. The right and left greater saphenous veins are not tortuous,      however, have breaks in their continuity most likely due to      previous ablations.  4. The deep venous system is competent.  5. The right and left lesser saphenous veins appear within normal      limits, however, were difficult to image well.      ___________________________________________  Larina Earthly, M.D.   AS/MEDQ  D:  07/30/2008  T:  07/30/2008  Job:  [id number]

## 2010-06-08 NOTE — Consult Note (Signed)
NEW PATIENT CONSULTATION   [facility], Rosario J  DOB:  Jul 14, 1965                                       07/30/2008  EAVWU#:[id number]   The patient presents today for evaluation of bilateral venous  hypertension and varicosities.  He is a very pleasant 45 year old  gentleman with progressive changes of venous hypertension in both legs,  which is more progressive in the right than on the left.  He reports  that he had been treated in 2007 with staged bilateral laser ablation of  the saphenous vein at an outlying vein center.  He reports that he  contacted them when he had recurrent problems and was instructed that  they had no other treatment options available.  He reports progressive  pain bilaterally most pronounced at the level of the right ankle.  He  has not had any episodes of bleeding from this varicosity and does not  have any history of deep vein thrombosis.  He is self employed in a  service station and stands for prolonged periods daily.  Patient states  prolonged standing is very painful and difficult. 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 He has had to decrease the hours that  he works by 50% because of leg pain. Patient used to walk 1 1/2 - 2  miles daily. He has had to stop walking for exercise because of leg  pain. Patient is unable to squat due to leg pain and swelling. This  negatively impacts his job duties at the gas station and his ability to  do yard work at home.   PAST MEDICAL HISTORY:  Otherwise unremarkable.  He does not have any  history of cardiac disease.  He is married with 1 child.  He does not  smoke, having quit 14 years ago and has occasional social alcohol  consumption.  He does report weighing 290 pounds.  He is 5 feet 10  inches tall.   REVIEW OF SYSTEMS:  Completely negative aside from the leg pain with  standing.   PHYSICAL EXAM:  He is a well-developed, moderately obese gentleman in no  acute distress.  He  does have palpable pedal pulses bilaterally.  He has  marked tributary varicosities in both legs, most prominently in his  right leg.  He does have the area of concern in his right ankle with a  very prominent telangiectasia, which is prominent in the skin.  He has  multiple areas of extremely thin varices over his calf.   MEDICAL DECISION MAKING:  He underwent formal venous duplex in our  office.  This is somewhat complex due to his prior ablation treatment.  It appears that he has had a partial occlusion of the mid distal  saphenous vein bilaterally.  Unfortunately, he continues to have dilated  refluxing saphenous vein from the saphenofemoral junction to the mid  thigh bilaterally and this feeds into the large varices in his right  thigh causing venous hypertension more distally.  The patient has been  extremely diligent in wearing his compression garments.  He actually has  3 pair so that he can change them out on a daily basis and reports that  he has had some symptom relief from these and despite wearing them for  greater than 1 year now he has had progressive pain most prominently at  the level of his ankles.  I discussed options with the patient.  I have  recommended that we proceed with laser ablation of his saphenous vein  from the mid thigh to his saphenofemoral junction for control of venous  hypertension.  He did not have any stab phlebectomy in his last  procedure at an outlying center, and I feel that this was in error since  this is clearly leading to his recurrent venous hypertension at the  level of the ankle.  We would recommend stab phlebectomy of his multiple  varicosities throughout his thigh and calf and also would recommend  sclerotherapy at the telangiectasia at his ankle which are causing him  the most pain currently.  He understands the procedure and we will  proceed once we have assured insurance coverage.   Larina Earthly, M.D.  Electronically Signed    TFE/MEDQ  D:  07/30/2008  T:  07/30/2008  Job:  [id number]   cc:   Marlowe Kays, M.D.

## 2012-02-16 ENCOUNTER — Other Ambulatory Visit: Payer: Self-pay | Admitting: *Deleted

## 2012-02-16 DIAGNOSIS — I83893 Varicose veins of bilateral lower extremities with other complications: Secondary | ICD-10-CM

## 2012-02-17 ENCOUNTER — Encounter: Payer: Self-pay | Admitting: Vascular Surgery

## 2012-02-20 ENCOUNTER — Encounter (INDEPENDENT_AMBULATORY_CARE_PROVIDER_SITE_OTHER): Payer: BC Managed Care – PPO | Admitting: *Deleted

## 2012-02-20 ENCOUNTER — Encounter: Payer: Self-pay | Admitting: Vascular Surgery

## 2012-02-20 ENCOUNTER — Ambulatory Visit (INDEPENDENT_AMBULATORY_CARE_PROVIDER_SITE_OTHER): Payer: BC Managed Care – PPO | Admitting: Vascular Surgery

## 2012-02-20 VITALS — BP 143/84 | HR 83 | Resp 20 | Ht 69.0 in | Wt 316.0 lb

## 2012-02-20 DIAGNOSIS — I83893 Varicose veins of bilateral lower extremities with other complications: Secondary | ICD-10-CM

## 2012-02-20 DIAGNOSIS — I8000 Phlebitis and thrombophlebitis of superficial vessels of unspecified lower extremity: Secondary | ICD-10-CM

## 2012-02-20 NOTE — Progress Notes (Signed)
Subjective:     Patient ID: Richard Mercer, male   DOB: [birth year], 47 y.o.   MRN: [id number]  HPI this 47 year old male is seen today for recent thrombophlebitis in the right leg with recurrent varicosities bilaterally. This patient had bilateral laser ablation procedures performed at [facility]  imaging in 2007. He was seen in followup 3 months later he was told there was nothing further they can do. He developed recurrent varicosities very rapidly as well as recurrent edema in the lower extremities. He was seen by Dr. early and 2010 at that time was found to have recanalization of both great saphenous vein from the saphenofemoral junction to the mid thigh supplying these recurrent varicosities. Dr. early recommended laser ablation and multiple stab phlebectomy the patient never returned to have this performed. He now returns with history of last week developing a painful lump in his right lower thigh which has been quite tender and he was started on antibiotics by his medical doctor and referred for evaluation. He has no history of DVT, stasis ulcers, bleeding, or other complications. He does have chronic edema and has for many years. He occasionally wears a long leg elastic compression stocking but not on a daily basis and does not elevate his legs daily.  No past medical history on file.  History  Substance Use Topics  . Smoking status: Former Smoker -- 12 years    Quit date: 02/19/1997  . Smokeless tobacco: Current User  . Alcohol Use: Yes    Family History  Problem Relation Age of Onset  . Diabetes Mother   . Hyperlipidemia Mother   . Hypertension Mother   . Hypertension Father   . Hyperlipidemia Father     No Known Allergies  Current outpatient prescriptions:Sulfamethoxazole-Trimethoprim (SULFAMETHOXAZOLE-TMP DS PO), Take by mouth 2 (two) times daily., Disp: , Rfl:   BP 143/84  Pulse 83  Resp 20  Ht 5\' 9"  (1.753 m)  Wt 316 lb (143.337 kg)  BMI 46.67 kg/m2  Body mass index is  46.67 kg/(m^2).          Review of Systems denies chest pain, dyspnea on exertion, PND, orthopnea, hemoptysis. Does complain of pain in his legs with walking.    Objective:   Physical Exam blood pressure 143 rate 4 heart rate 83 respirations 20 Gen.-alert and oriented x3 in no apparent distress-overweight  HEENT normal for age Lungs no rhonchi or wheezing Cardiovascular regular rhythm no murmurs carotid pulses 3+ palpable no bruits audible Abdomen soft nontender no palpable masses Musculoskeletal free of  major deformities Skin clear -no rashes Neurologic normal Lower extremities 3+ femoral and dorsalis pedis pulses palpable bilaterally  Right leg with multiple bulging varicosities in the medial thigh and medial calf with hyperpigmentation lower third of the leg and 1-2+ edema. He has resolving acute thrombophlebitis in the distal thigh with a large varix thrombosed. This measures about 3-4 cm in diameter. Left leg has bulging varicosities in the left medial distal thigh extending into the medial calf with 1-2+ edema distally multiple reticular and spider veins. No active ulcers in either leg noted.  Today I ordered bilateral lower extremity venous duplex exam which I reviewed and interpreted. There is recanalization of the right great saphenous vein which is patent from the mid thigh to the saphenofemoral junction is a large vein and has gross reflux. There is no deep vein reflux or DVT. Left leg has gross reflux in the week and lives great saphenous vein from distal thigh to  the saphenofemoral junction but no DVT and some deep reflux on the left.      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 7 years post laser ablation bilateral great saphenous veins performed at another facility #2 recent onset acute thrombophlebitis-thrombosed varix and right distal thigh #3 documented gross reflux bilateral great saphenous systems from mid thigh to  saphenofemoral junction    Plan:     #1 long-leg elastic compression stockings 20-30 mm gradient #2 elevate legs as much as possible during the day #3 ibuprofen on a daily basis #4 moist heat for area of recent thrombophlebitis #5 return in 3 months-if no dramatic improvement patient will need #1 laser ablation right great saphenous vein from mid thigh the saphenofemoral junction with greater than 20 stab phlebectomy followed by #2 laser ablation mid thigh to saphenofemoral junction great saphenous vein followed by greater than 20 stab phlebectomy

## 2012-03-14 ENCOUNTER — Encounter: Payer: BC Managed Care – PPO | Admitting: Vascular Surgery

## 2012-04-11 ENCOUNTER — Encounter (INDEPENDENT_AMBULATORY_CARE_PROVIDER_SITE_OTHER): Payer: BC Managed Care – PPO

## 2012-04-11 DIAGNOSIS — I83893 Varicose veins of bilateral lower extremities with other complications: Secondary | ICD-10-CM

## 2012-05-22 ENCOUNTER — Ambulatory Visit: Payer: BC Managed Care – PPO | Admitting: Vascular Surgery

## 2012-06-15 ENCOUNTER — Encounter: Payer: Self-pay | Admitting: Vascular Surgery

## 2012-06-19 ENCOUNTER — Ambulatory Visit: Payer: BC Managed Care – PPO | Admitting: Vascular Surgery

## 2017-11-17 ENCOUNTER — Other Ambulatory Visit: Payer: Self-pay | Admitting: Family Medicine

## 2017-11-17 DIAGNOSIS — R7989 Other specified abnormal findings of blood chemistry: Secondary | ICD-10-CM

## 2017-11-17 DIAGNOSIS — R945 Abnormal results of liver function studies: Secondary | ICD-10-CM

## 2017-11-29 ENCOUNTER — Ambulatory Visit
Admission: RE | Admit: 2017-11-29 | Discharge: 2017-11-29 | Disposition: A | Discharge status: Home / Self Care | Payer: BLUE CROSS/BLUE SHIELD | Source: Ambulatory Visit | Attending: Family Medicine | Admitting: Family Medicine

## 2017-11-29 DIAGNOSIS — R945 Abnormal results of liver function studies: Secondary | ICD-10-CM

## 2017-11-29 DIAGNOSIS — R7989 Other specified abnormal findings of blood chemistry: Secondary | ICD-10-CM

## 2017-12-15 ENCOUNTER — Other Ambulatory Visit: Payer: Self-pay | Admitting: Gastroenterology

## 2017-12-15 DIAGNOSIS — M79605 Pain in left leg: Secondary | ICD-10-CM

## 2017-12-15 DIAGNOSIS — K7460 Unspecified cirrhosis of liver: Secondary | ICD-10-CM

## 2017-12-15 DIAGNOSIS — M7989 Other specified soft tissue disorders: Secondary | ICD-10-CM

## 2017-12-15 DIAGNOSIS — M79604 Pain in right leg: Secondary | ICD-10-CM

## 2017-12-25 ENCOUNTER — Other Ambulatory Visit: Payer: BLUE CROSS/BLUE SHIELD

## 2017-12-28 ENCOUNTER — Ambulatory Visit
Admission: RE | Admit: 2017-12-28 | Discharge: 2017-12-28 | Disposition: A | Discharge status: Home / Self Care | Payer: BLUE CROSS/BLUE SHIELD | Source: Ambulatory Visit | Attending: Gastroenterology | Admitting: Gastroenterology

## 2017-12-28 DIAGNOSIS — M79605 Pain in left leg: Secondary | ICD-10-CM

## 2017-12-28 DIAGNOSIS — K7460 Unspecified cirrhosis of liver: Secondary | ICD-10-CM

## 2017-12-28 DIAGNOSIS — M7989 Other specified soft tissue disorders: Secondary | ICD-10-CM

## 2017-12-28 DIAGNOSIS — M79604 Pain in right leg: Secondary | ICD-10-CM

## 2017-12-28 MED ORDER — IOPAMIDOL (ISOVUE-300) INJECTION 61%
125.0000 mL | Freq: Once | INTRAVENOUS | Status: AC | PRN
  Administered 2017-12-28: 125 mL via INTRAVENOUS

## 2018-06-29 ENCOUNTER — Other Ambulatory Visit: Payer: Self-pay | Admitting: Gastroenterology

## 2018-06-29 DIAGNOSIS — K7460 Unspecified cirrhosis of liver: Secondary | ICD-10-CM

## 2018-08-08 ENCOUNTER — Ambulatory Visit
Admission: RE | Admit: 2018-08-08 | Discharge: 2018-08-08 | Disposition: A | Discharge status: Home / Self Care | Payer: BC Managed Care – PPO | Source: Ambulatory Visit | Attending: Gastroenterology | Admitting: Gastroenterology

## 2018-08-08 ENCOUNTER — Other Ambulatory Visit: Payer: Self-pay

## 2018-08-08 DIAGNOSIS — K7460 Unspecified cirrhosis of liver: Secondary | ICD-10-CM

## 2020-01-09 IMAGING — US US EXTREM LOW VENOUS BILAT
1 series · 13 of 24 positions shown · non-contrast
Comparison: None.

CLINICAL DATA: Bilateral lower extremity pain



[Series 1: us extrem low venous bilat · 0.07mm/px · 13 of 62 slices shown]
[im 1/62]
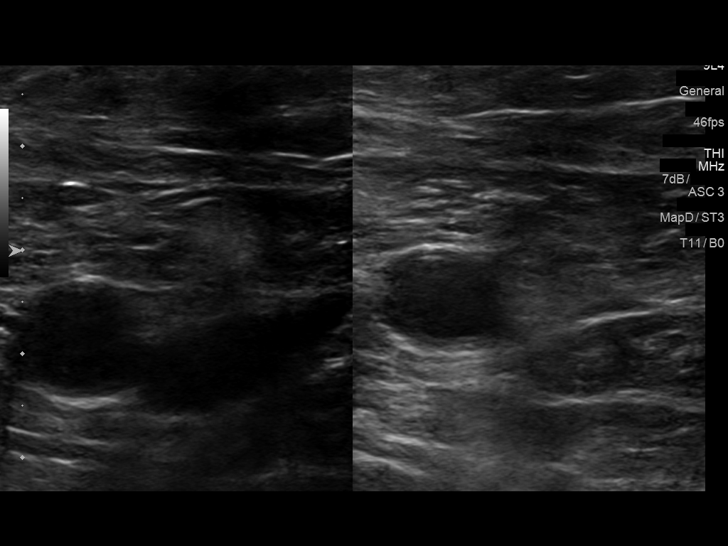
[im 6/62]
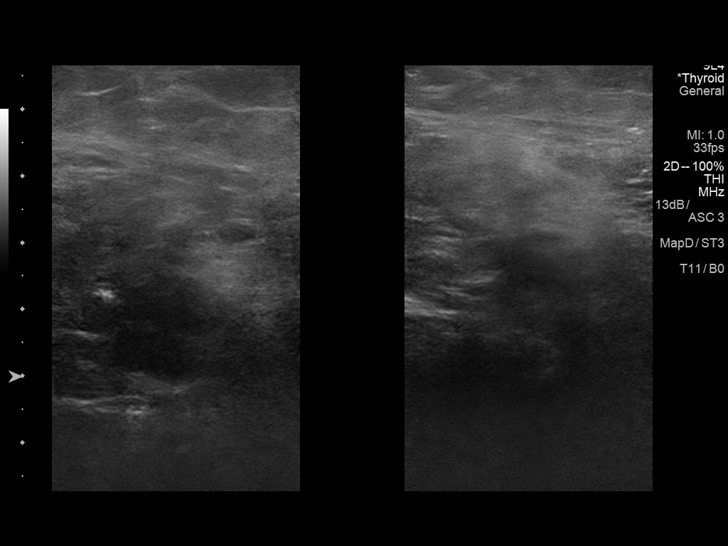
[im 11/62]
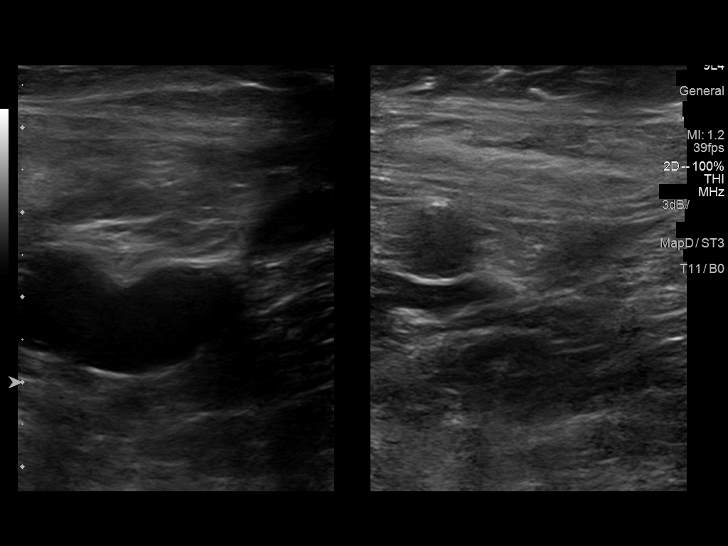
[im 16/62]
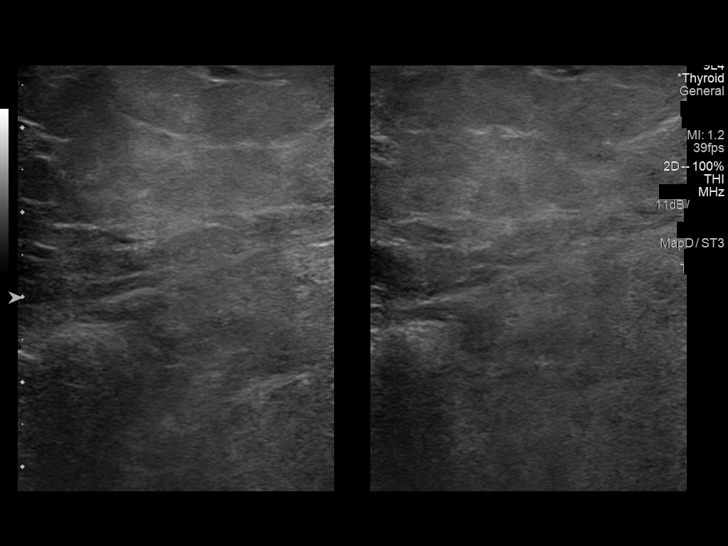
[im 22/62]
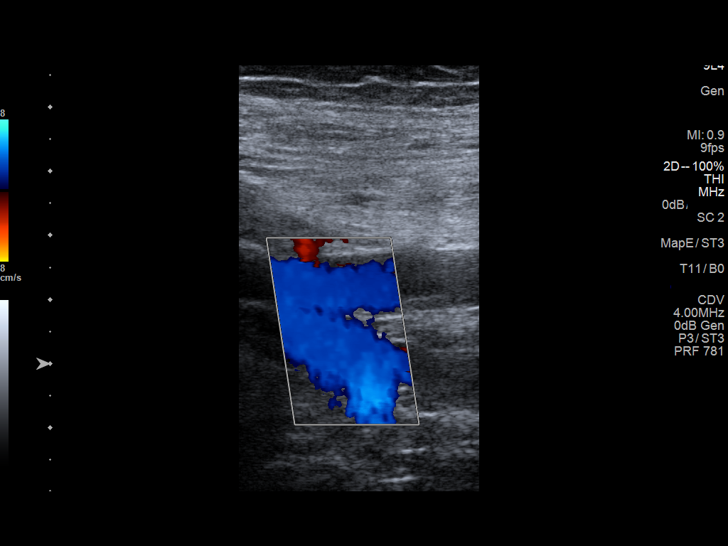
[im 27/62]
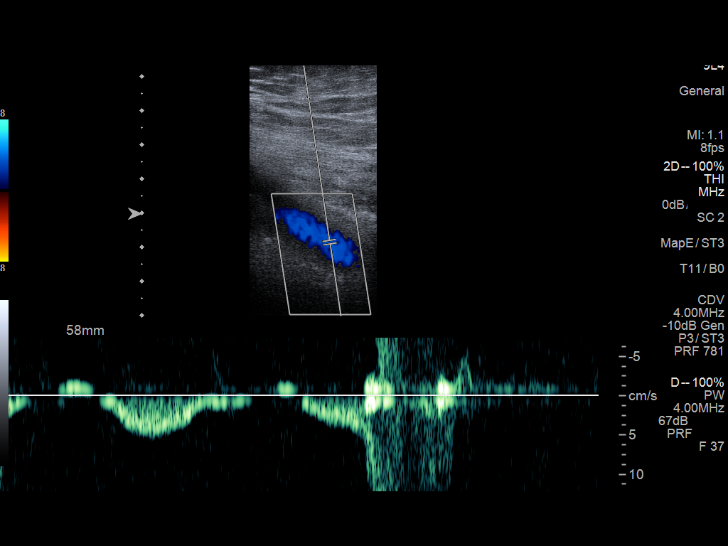
[im 32/62]
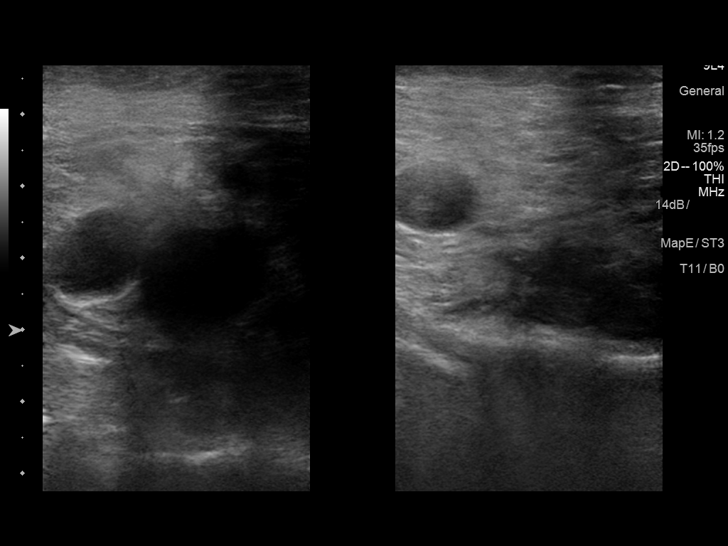
[im 35/62]
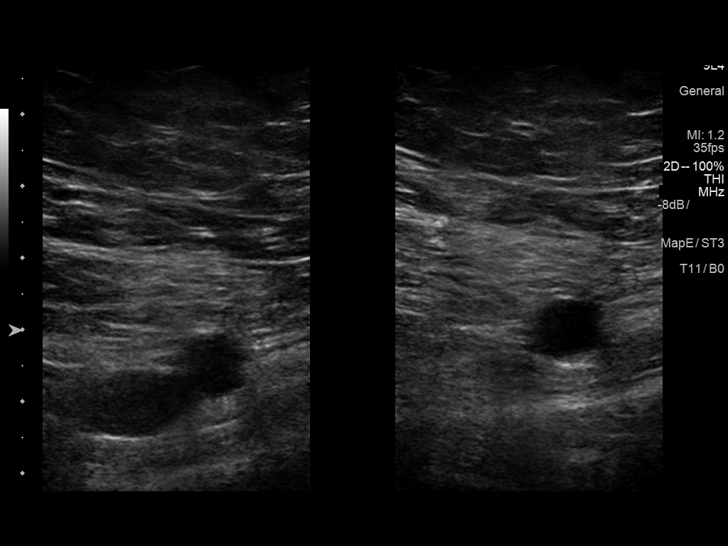
[im 40/62]
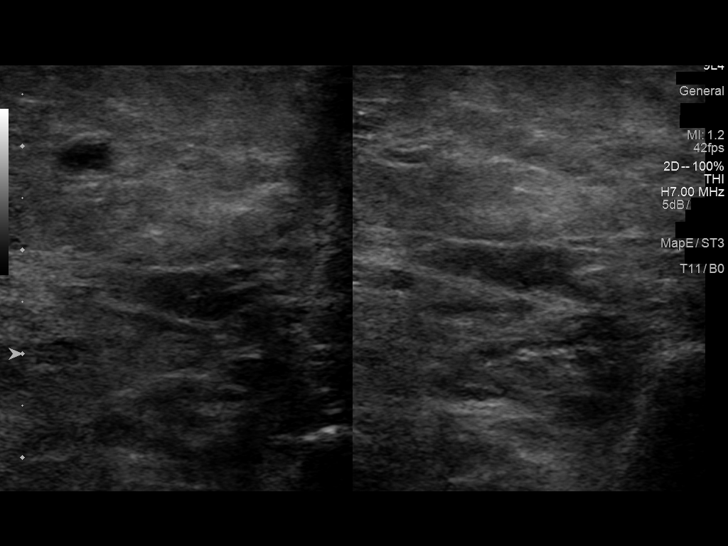
[im 46/62]
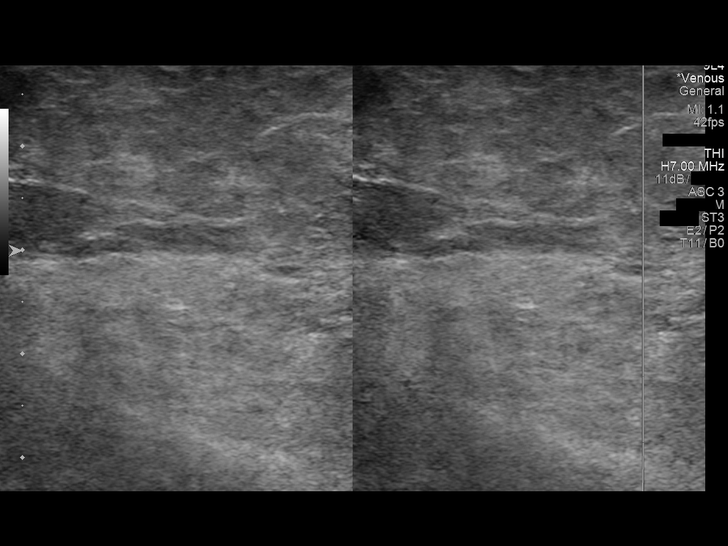
[im 51/62]
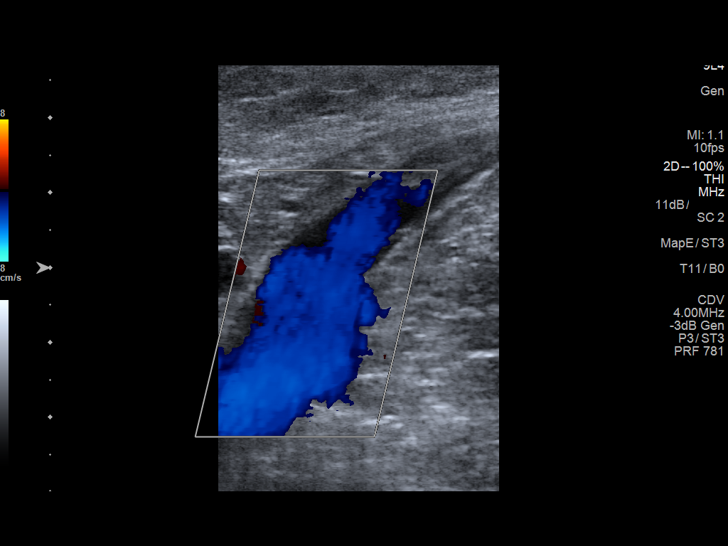
[im 56/62]
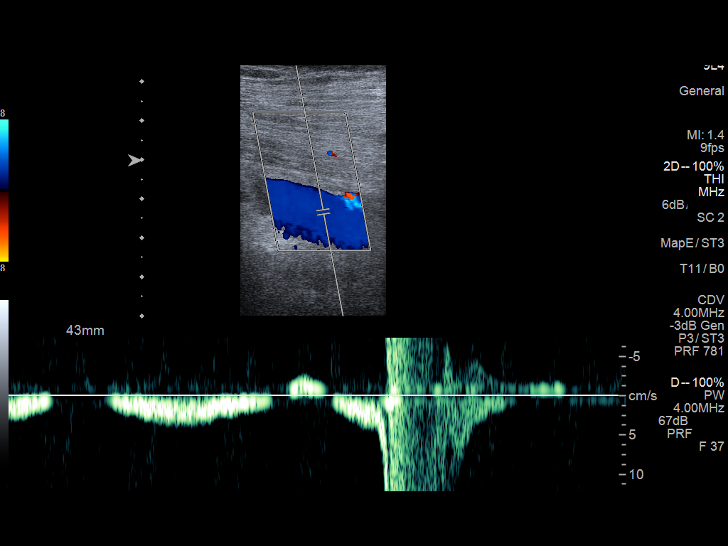
[im 62/62]
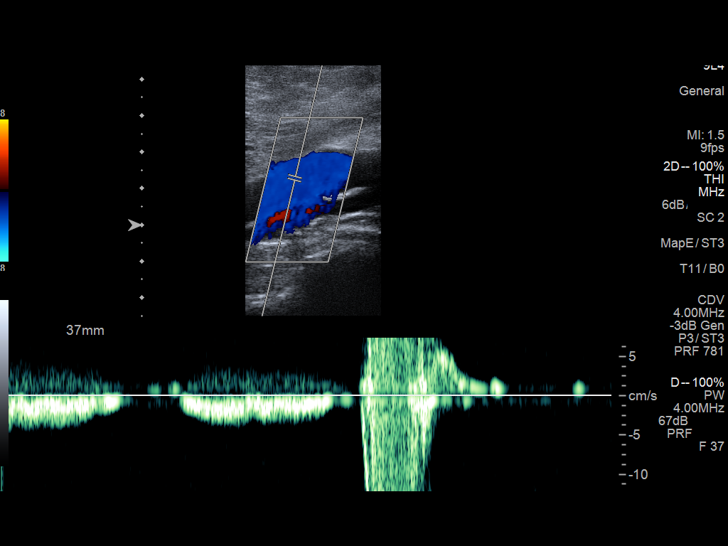

[13 of 24 positions shown; findings below may reference images not displayed]

FINDINGS: RIGHT LOWER EXTREMITY

Common Femoral Vein: No evidence of thrombus. Normal
compressibility, respiratory phasicity and response to augmentation.

Saphenofemoral Junction: No evidence of thrombus. Normal
compressibility and flow on color Doppler imaging.

Profunda Femoral Vein: No evidence of thrombus. Normal
compressibility and flow on color Doppler imaging.

Femoral Vein: No evidence of thrombus. Normal compressibility,
respiratory phasicity and response to augmentation.

Popliteal Vein: No evidence of thrombus. Normal compressibility,
respiratory phasicity and response to augmentation.

Calf Veins: No evidence of thrombus. Normal compressibility and flow
on color Doppler imaging.

Superficial Great Saphenous Vein: Right GSV demonstrates hypoechoic
thrombus and appears noncompressible compatible with superficial
thrombosis/thrombophlebitis.

Venous Reflux:  None.

Other Findings:  None.

LEFT LOWER EXTREMITY

Common Femoral Vein: No evidence of thrombus. Normal
compressibility, respiratory phasicity and response to augmentation.

Saphenofemoral Junction: No evidence of thrombus. Normal
compressibility and flow on color Doppler imaging.

Profunda Femoral Vein: No evidence of thrombus. Normal
compressibility and flow on color Doppler imaging.

Femoral Vein: No evidence of thrombus. Normal compressibility,
respiratory phasicity and response to augmentation.

Popliteal Vein: No evidence of thrombus. Normal compressibility,
respiratory phasicity and response to augmentation.

Calf Veins: No evidence of thrombus. Normal compressibility and flow
on color Doppler imaging.

Superficial Great Saphenous Vein: No evidence of thrombus. Normal
compressibility.

Venous Reflux:  None.

Other Findings:  None.
IMPRESSION: Negative for significant DVT in either extremity.

Diffuse right GSV superficial thrombosis/thrombophlebitis.

## 2020-02-03 ENCOUNTER — Other Ambulatory Visit: Payer: Self-pay

## 2020-02-03 ENCOUNTER — Ambulatory Visit (INDEPENDENT_AMBULATORY_CARE_PROVIDER_SITE_OTHER): Payer: 59 | Admitting: Podiatry

## 2020-02-03 ENCOUNTER — Ambulatory Visit: Payer: 59 | Admitting: Orthotics

## 2020-02-03 ENCOUNTER — Encounter: Payer: Self-pay | Admitting: Podiatry

## 2020-02-03 ENCOUNTER — Ambulatory Visit (INDEPENDENT_AMBULATORY_CARE_PROVIDER_SITE_OTHER): Payer: 59

## 2020-02-03 DIAGNOSIS — M79672 Pain in left foot: Secondary | ICD-10-CM

## 2020-02-03 DIAGNOSIS — I83893 Varicose veins of bilateral lower extremities with other complications: Secondary | ICD-10-CM

## 2020-02-03 DIAGNOSIS — M79671 Pain in right foot: Secondary | ICD-10-CM

## 2020-02-03 DIAGNOSIS — M722 Plantar fascial fibromatosis: Secondary | ICD-10-CM

## 2020-02-03 DIAGNOSIS — M778 Other enthesopathies, not elsewhere classified: Secondary | ICD-10-CM

## 2020-02-03 MED ORDER — DICLOFENAC SODIUM 75 MG PO TBEC: 75.0000 mg | DELAYED_RELEASE_TABLET | Freq: Two times a day (BID) | ORAL | 2 refills | Status: DC

## 2020-02-03 NOTE — Progress Notes (Signed)
Patient came into today for casting bilateral f/o to address plantar fasciitis.  Patient reports history of foot pain involving plantar aponeurosis.  Goal is to provide longitudinal arch support and correct any RF instability due to heel eversion/inversion.  Ultimate goal is to relieve tension at pf insertion calcaneal tuberosity.  Plan on semi-rigid device addressing heel stability and relieving PF tension.     Dr. Charlsie Merles to put in charges.Marland Kitchen

## 2020-02-03 NOTE — Patient Instructions (Signed)

## 2020-02-04 NOTE — Progress Notes (Signed)
Subjective:   Patient ID: Richard Mercer, male   DOB: 55 y.o.   MRN: [id number]   HPI Patient presents stating he has developed a lot of pain in the bottom of his right heel and also he needs orthotics as he has flatfeet and pain in his feet as he has to stand at x10 to 12 hours. Patient does not smoke currently likes to be active   Review of Systems  All other systems reviewed and are negative.       Objective:  Physical Exam Vitals and nursing note reviewed.  Constitutional:      Appearance: He is well-developed and well-nourished.  Cardiovascular:     Pulses: Intact distal pulses.  Pulmonary:     Effort: Pulmonary effort is normal.  Musculoskeletal:        General: Normal range of motion.  Skin:    General: Skin is warm.  Neurological:     Mental Status: He is alert.     Neurovascular status was found to be intact muscle strength adequate range of motion adequate with exquisite discomfort plantar aspect right heel insertional point tendon calcaneus with also pain left foot on the dorsal lateral surface. Patient is found to have good digital perfusion well oriented x3     Assessment:  Cute fasciitis right heel at the insertion of the tendon the calcaneus with inflammation pain of the extensor complex left which may be compensatory     Plan:  H&P reviewed both conditions and were focusing on the right and I did sterile prep injected the plantar fascial insertion 3 mg Kenalog 5 mg Xylocaine applied fascial brace with instructions on usage along with shoe gear modifications placed on diclofenac 75 mg twice daily and do not recommend current treatment left but may be necessary and casted for functional orthotic devices by ped orthotist  X-rays indicate that the patient has spur formation no indication stress fracture arthritis

## 2020-03-02 ENCOUNTER — Other Ambulatory Visit: Payer: Self-pay

## 2020-03-02 ENCOUNTER — Ambulatory Visit: Payer: 59 | Admitting: Orthotics

## 2020-03-02 DIAGNOSIS — M79671 Pain in right foot: Secondary | ICD-10-CM

## 2020-03-02 DIAGNOSIS — M778 Other enthesopathies, not elsewhere classified: Secondary | ICD-10-CM

## 2020-03-02 DIAGNOSIS — M722 Plantar fascial fibromatosis: Secondary | ICD-10-CM

## 2020-03-02 NOTE — Progress Notes (Signed)
Patient picked up f/o and was pleased with fit, comfort, and function.  Worked well with footwear.  Told of rbeak in period and how to report any issues.  

## 2022-05-30 ENCOUNTER — Ambulatory Visit: Payer: Commercial Managed Care - HMO | Admitting: Internal Medicine

## 2022-05-30 ENCOUNTER — Encounter: Payer: Self-pay | Admitting: Internal Medicine

## 2022-05-30 VITALS — BP 124/82 | HR 72 | Ht 69.0 in | Wt 274.0 lb

## 2022-05-30 DIAGNOSIS — E119 Type 2 diabetes mellitus without complications: Secondary | ICD-10-CM | POA: Diagnosis not present

## 2022-05-30 DIAGNOSIS — Z7984 Long term (current) use of oral hypoglycemic drugs: Secondary | ICD-10-CM

## 2022-05-30 LAB — POCT GLYCOSYLATED HEMOGLOBIN (HGB A1C): Hemoglobin A1C: 6.8 % — AB (ref 4.0–5.6)

## 2022-05-30 LAB — POCT GLUCOSE (DEVICE FOR HOME USE): Glucose Fasting, POC: 151 mg/dL — AB (ref 70–99)

## 2022-05-30 MED ORDER — ACCU-CHEK GUIDE VI STRP: 1.0000 | ORAL_STRIP | Freq: Every day | 3 refills | Status: AC

## 2022-05-30 MED ORDER — EMPAGLIFLOZIN 25 MG PO TABS: 25.0000 mg | ORAL_TABLET | Freq: Every day | ORAL | 3 refills | Status: DC

## 2022-05-30 MED ORDER — METFORMIN HCL 1000 MG PO TABS: 1000.0000 mg | ORAL_TABLET | Freq: Two times a day (BID) | ORAL | 3 refills | Status: DC

## 2022-05-30 NOTE — Progress Notes (Signed)
Name: Richard Mercer  MRN/ DOB: [id number], [birth year]   Age/ Sex: 57 y.o., male    PCP: Juluis Rainier, MD (Inactive)   Reason for Endocrinology Evaluation: Type 2 Diabetes Mellitus     Date of Initial Endocrinology Visit: 05/30/2022     PATIENT IDENTIFIER: Richard Mercer is a 57 y.o. male with a past medical history of DM, HTN, and dyslipidemia. The patient presented for initial endocrinology clinic visit on 05/30/2022 for consultative assistance with his diabetes management.    HPI: Mr. Richard Mercer was    Diagnosed with DM 2015 Prior Medications tried/Intolerance: as listed  Currently checking blood sugars 1 x / month   Hypoglycemia episodes : no             Hemoglobin A1c has ranged from 6.7% in 2021, peaking at 7.9% in 2023.   In terms of diet, the patient eats 2 meals a day.   Patient transferred care from [facility]  Last UTI was 2 yrs ago while on Jardiance      HOME DIABETES REGIMEN: Metformin 1000 mg twice daily Jardiance 25 mg daily   Statin: yes ACE-I/ARB: Yes    METER DOWNLOAD SUMMARY: n/a   DIABETIC COMPLICATIONS: Microvascular complications:   Denies: CKD, neuropathy  Last eye exam: Completed 2022  Macrovascular complications:   Denies: CAD, PVD, CVA   PAST HISTORY: Past Medical History: No past medical history on file. Past Surgical History:  Past Surgical History:  Procedure Laterality Date   BONE SPUR     RIGHT HEEL   VARICOSE VEIN SURGERY  2008   B/L    Social History:  reports that he quit smoking about 25 years ago. His smoking use included cigarettes. He uses smokeless tobacco. He reports current alcohol use. He reports that he does not use drugs. Family History:  Family History  Problem Relation Age of Onset   Diabetes Mother    Hyperlipidemia Mother    Hypertension Mother    Hypertension Father    Hyperlipidemia Father      HOME MEDICATIONS: Allergies as of 05/30/2022   No Known Allergies      Medication List         Accurate as of May 30, 2022  1:44 PM. If you have any questions, ask your nurse or doctor.          STOP taking these medications    diclofenac 75 MG EC tablet Commonly known as: VOLTAREN Stopped by: Scarlette Shorts, MD   SULFAMETHOXAZOLE-TMP DS PO Stopped by: Scarlette Shorts, MD       TAKE these medications    Accu-Chek Guide test strip Generic drug: glucose blood 1 each by Other route daily in the afternoon. Use as instructed Started by: Scarlette Shorts, MD   atorvastatin 10 MG tablet Commonly known as: LIPITOR Take by mouth.   empagliflozin 25 MG Tabs tablet Commonly known as: Jardiance Take 1 tablet (25 mg total) by mouth daily. What changed:  how much to take when to take this Changed by: Scarlette Shorts, MD   losartan 100 MG tablet Commonly known as: COZAAR Take 100 mg by mouth daily.   metFORMIN 1000 MG tablet Commonly known as: GLUCOPHAGE Take 1 tablet (1,000 mg total) by mouth 2 (two) times daily.         ALLERGIES: No Known Allergies   REVIEW OF SYSTEMS: A comprehensive ROS was conducted with the patient and is negative except as per HPI and below:  Review of Systems  Gastrointestinal:  Negative for constipation, diarrhea and nausea.  Neurological:  Negative for tingling.      OBJECTIVE:   VITAL SIGNS: BP 124/82 (BP Location: Left Arm, Patient Position: Sitting, Cuff Size: Large)   Pulse 72   Ht 5\' 9"  (1.753 m)   Wt 274 lb (124.3 kg)   SpO2 99%   BMI 40.46 kg/m    PHYSICAL EXAM:  General: Pt appears well and is in NAD  Neck: General: Supple without adenopathy or carotid bruits. Thyroid: Thyroid size normal.  No goiter or nodules appreciated.   Lungs: Clear with good BS bilat with no rales, rhonchi, or wheezes  Heart: RRR   Abdomen:  soft, nontender  Extremities:  Lower extremities - No pretibial edema.  Neuro: MS is good with appropriate affect, pt is alert and Ox3    DM foot exam: 05/30/2022  The  skin of the feet is intact without sores or ulcerations. The pedal pulses are 2+ on right and 2+ on left. The sensation is intact to a screening 5.07, 10 gram monofilament bilaterally    DATA REVIEWED:  02/03/2022 BUN/Cr 16/0.820 GFR 104 LDL 70 TG 136 HDL 41      ASSESSMENT / PLAN / RECOMMENDATIONS:   1) Type 2 Diabetes Mellitus, Optimally controlled, Without complications - Most recent A1c of 6.8 %. Goal A1c < 7.0 %.    -A1c at goal -Tolerating metformin and Jardiance, used to have UTIs with Jardiance but the last episode was 2 years ago -Patient follows a low-carb diet -No changes at this time and encouraged to continue with lifestyle changes -Labs through PCP were reviewed -Accu-Chek meter was provided today, extra strips sent to the pharmacy, patient understand that this may not be covered by his insurance and he will notify us of covered meters if this is not one of them  MEDICATIONS: Continue metformin 1000 mg twice daily Continue Jardiance 25 mg daily  EDUCATION / INSTRUCTIONS: BG monitoring instructions: Patient is instructed to check his blood sugars 1 times a day. Call [facility] if: BG persistently < 70  I reviewed the Rule of 15 for the treatment of hypoglycemia in detail with the patient. Literature supplied.   2) Diabetic complications:  Eye: Does not have known diabetic retinopathy.  Neuro/ Feet: Does not have known diabetic peripheral neuropathy. Renal: Patient does not have known baseline CKD. He is  on an ACEI/ARB at present.  3) Lipids: Patient is on statin therapy    Follow-up in 6 months  Signed electronically by: Lyndle Herrlich, MD  [facility]  [facility] [address] [place], [place] [address] Phone: [phone] FAX: [phone]   CC: Juluis Rainier, MD (Inactive) [address] [place] [address] Phone: [phone]  Fax: [phone]    Return to  Endocrinology clinic as below: Future Appointments  Date Time Provider Department Center  12/06/2022  1:00 PM Laydon Martis, Konrad Dolores, MD [id number] None

## 2022-05-30 NOTE — Patient Instructions (Signed)
Continue Metformin 1000 mg twice daily  Continue Jardiance 25 mg daily     HOW TO TREAT LOW BLOOD SUGARS (Blood sugar LESS THAN 70 MG/DL) Please follow the RULE OF 15 for the treatment of hypoglycemia treatment (when your (blood sugars are less than 70 mg/dL)   STEP 1: Take 15 grams of carbohydrates when your blood sugar is low, which includes:  3-4 GLUCOSE TABS  OR 3-4 OZ OF JUICE OR REGULAR SODA OR ONE TUBE OF GLUCOSE GEL    STEP 2: RECHECK blood sugar in 15 MINUTES STEP 3: If your blood sugar is still low at the 15 minute recheck --> then, go back to STEP 1 and treat AGAIN with another 15 grams of carbohydrates.

## 2022-08-31 ENCOUNTER — Other Ambulatory Visit: Payer: Self-pay | Admitting: *Deleted

## 2022-08-31 DIAGNOSIS — I83893 Varicose veins of bilateral lower extremities with other complications: Secondary | ICD-10-CM

## 2022-09-21 ENCOUNTER — Ambulatory Visit (HOSPITAL_COMMUNITY)
Admission: RE | Admit: 2022-09-21 | Discharge: 2022-09-21 | Disposition: A | Discharge status: Home / Self Care | Payer: Commercial Managed Care - HMO | Source: Ambulatory Visit | Attending: Vascular Surgery | Admitting: Vascular Surgery

## 2022-09-21 ENCOUNTER — Ambulatory Visit: Payer: Commercial Managed Care - HMO | Admitting: Physician Assistant

## 2022-09-21 VITALS — BP 115/80 | HR 88 | Temp 98.0°F | Wt 271.0 lb

## 2022-09-21 DIAGNOSIS — I872 Venous insufficiency (chronic) (peripheral): Secondary | ICD-10-CM

## 2022-09-21 DIAGNOSIS — I83893 Varicose veins of bilateral lower extremities with other complications: Secondary | ICD-10-CM | POA: Insufficient documentation

## 2022-09-21 NOTE — Progress Notes (Signed)
[facility] [place]   Reason for referral: Swollen B leg  History of Present Illness  Richard Mercer is a 57 y.o. 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  This patient had bilateral laser ablation procedures performed at [facility] imaging in 2007. He developed recurrent varicosities very rapidly as well as recurrent edema in the lower extremities. He was seen by Dr. Arbie Cookey and 2010 at that time was found to have recanalization of both great saphenous vein from the saphenofemoral junction to the mid thigh supplying these recurrent varicosities. Dr. Arbie Cookey recommended laser ablation and multiple stab phlebectomy the patient never returned to have this performed. He now returns with history of last week developing a painful lump in his right lower thigh which has been quite tender and he was started on antibiotics by his medical doctor and referred for evaluation. He has no history of DVT, stasis ulcers, bleeding, or other complications. He does have chronic edema and has for many years. He occasionally wears a long leg elastic compression stocking but not on a daily basis and does not elevate his legs daily.   Since he was seen in 2014 by Dr. Hart Rochester He was told to continue with conservative treatment to include thigh high compression 20-30 mmhg, which he still wears daily.  Exercise and elevation when at rest.  He states he has lost > 50 lbs.  He was scheduled to return after 3 months for exam and discussion.    No past medical history on file.  Past Surgical History:  Procedure Laterality Date   BONE SPUR     RIGHT HEEL   VARICOSE VEIN SURGERY  2008   B/L    Social History   Socioeconomic History   Marital status: Married    Spouse name: Not on file   Number of children: Not on file   Years of education: Not on file   Highest education level: Not on file  Occupational History   Not on file  Tobacco Use   Smoking status: Former     Current packs/day: 0.00    Types: Cigarettes    Start date: 02/19/1985    Quit date: 02/19/1997    Years since quitting: 25.6   Smokeless tobacco: Current  Substance and Sexual Activity   Alcohol use: Yes   Drug use: No   Sexual activity: Not on file  Other Topics Concern   Not on file  Social History Narrative   Not on file   Social Determinants of Health   Financial Resource Strain: Not on file  Food Insecurity: Not on file  Transportation Needs: Not on file  Physical Activity: Not on file  Stress: Not on file  Social Connections: Not on file  Intimate Partner Violence: Not on file    Family History  Problem Relation Age of Onset   Diabetes Mother    Hyperlipidemia Mother    Hypertension Mother    Hypertension Father    Hyperlipidemia Father     Current Outpatient Medications on File Prior to Visit  Medication Sig Dispense Refill   atorvastatin (LIPITOR) 10 MG tablet Take by mouth.     empagliflozin (JARDIANCE) 25 MG TABS tablet Take 1 tablet (25 mg total) by mouth daily. 90 tablet 3   glucose blood (ACCU-CHEK GUIDE) test strip 1 each by Other route daily in the afternoon. Use as instructed 100 each 3   losartan (COZAAR) 100 MG tablet Take 100  mg by mouth daily.     metFORMIN (GLUCOPHAGE) 1000 MG tablet Take 1 tablet (1,000 mg total) by mouth 2 (two) times daily. 180 tablet 3   No current facility-administered medications on file prior to visit.    Allergies as of 09/21/2022   (No Known Allergies)     ROS:   General:  No weight loss, Fever, chills  HEENT: No recent headaches, no nasal bleeding, no visual changes, no sore throat  Neurologic: No dizziness, blackouts, seizures. No recent symptoms of stroke or mini- stroke. No recent episodes of slurred speech, or temporary blindness.  Cardiac: No recent episodes of chest pain/pressure, no shortness of breath at rest.  No shortness of breath with exertion.  Denies history of atrial fibrillation or irregular  heartbeat  Vascular: No history of rest pain in feet.  No history of claudication.  No history of non-healing ulcer, No history of DVT   Pulmonary: No home oxygen, no productive cough, no hemoptysis,  No asthma or wheezing  Musculoskeletal:  [ ]  Arthritis, [ ]  Low back pain,  [ ]  Joint pain  Hematologic:No history of hypercoagulable state.  No history of easy bleeding.  No history of anemia  Gastrointestinal: No hematochezia or melena,  No gastroesophageal reflux, no trouble swallowing  Urinary: [ ]  chronic Kidney disease, [ ]  on HD - [ ]  MWF or [ ]  TTHS, [ ]  Burning with urination, [ ]  Frequent urination, [ ]  Difficulty urinating;   Skin: No rashes  Psychological: No history of anxiety,  No history of depression  Physical Examination  Vitals:   09/21/22 1300  BP: 115/80  Pulse: 88  Temp: 98 F (36.7 C)  TempSrc: Temporal  SpO2: 97%  Weight: 271 lb (122.9 kg)    Body mass index is 40.02 kg/m.  General:  Alert and oriented, no acute distress HEENT: Normal Neck: No bruit or JVD Pulmonary: Clear to auscultation bilaterally Cardiac: Regular Rate and Rhythm without murmur Abdomen: Soft, non-tender, non-distended, no mass, no scars Skin: No rash       Extremity Pulses:   radial,femoral, dorsalis pedis, posterior tibial pulses bilaterally Musculoskeletal: No deformity or edema  Neurologic: Upper and lower extremity motor 5/5 and symmetric  DATA: Venous Reflux Times  +--------------+--------+------+----------+------------+-------------------  ----+  RIGHT        Reflux  Reflux  Reflux  Diameter cmsComments                                No       Yes     Time                                         +--------------+--------+------+----------+------------+-------------------  ----+  CFV                   yes  >1 second                                       +--------------+--------+------+----------+------------+-------------------  ----+  FV  mid        no                                                            +--------------+--------+------+----------+------------+-------------------  ----+  Popliteal    no                                                            +--------------+--------+------+----------+------------+-------------------  ----+  GSV at SFJ             yes   >500 ms      .895                              +--------------+--------+------+----------+------------+-------------------  ----+  GSV prox thigh         yes   >500 ms      .728                              +--------------+--------+------+----------+------------+-------------------  ----+  GSV mid thigh                                     prior                                                                       ablation/stripping        +--------------+--------+------+----------+------------+-------------------  ----+  GSV dist thigh                                    prior                                                                       ablation/stripping        +--------------+--------+------+----------+------------+-------------------  ----+  GSV at knee                                       prior                                                                       ablation/stripping        +--------------+--------+------+----------+------------+-------------------  ----+  GSV prox calf          yes   >500 ms      .404                              +--------------+--------+------+----------+------------+-------------------  ----+  SSV Pop Fossa no                          .260                              +--------------+--------+------+----------+------------+-------------------  ----+  SSV prox calf no                          .481                              +--------------+--------+------+----------+------------+-------------------   ----+     +--------------+--------+------+----------+------------+-------------------  ----+  LEFT         Reflux  Reflux  Reflux  Diameter cmsComments                                No       Yes     Time                                         +--------------+--------+------+----------+------------+-------------------  ----+  CFV                   yes  >1 second                                       +--------------+--------+------+----------+------------+-------------------  ----+  FV mid        no                                                            +--------------+--------+------+----------+------------+-------------------  ----+  Popliteal    no                                                            +--------------+--------+------+----------+------------+-------------------  ----+  GSV at SFJ             yes   >500 ms      1.10                              +--------------+--------+------+----------+------------+-------------------  ----+  GSV prox thighno                          .376                              +--------------+--------+------+----------+------------+-------------------  ----+  GSV mid thigh  prior                                                                       ablation/stripping        +--------------+--------+------+----------+------------+-------------------  ----+  GSV dist thigh                                    prior                                                                       ablation/stripping        +--------------+--------+------+----------+------------+-------------------  ----+  GSV at knee                                       prior                                                                       ablation/stripping         +--------------+--------+------+----------+------------+-------------------  ----+  GSV prox calf no                          .328                              +--------------+--------+------+----------+------------+-------------------  ----+  SSV Pop Fossa no                          .189                              +--------------+--------+------+----------+------------+-------------------  ----+  SSV prox calf no                          .170                              +--------------+--------+------+----------+------------+-------------------  ----+      Summary:  Right:  - No evidence of deep vein thrombosis seen in the right lower extremity,  from the common femoral through the popliteal veins.  - No evidence of superficial venous thrombosis in the right lower  extremity.  - Venous reflux is noted in the right common femoral vein.  - Venous reflux is noted in the right sapheno-femoral junction.  - Venous reflux is noted in the right greater saphenous vein in the thigh.  -  Venous reflux is noted in the right greater saphenous vein in the calf.    Left:  - No evidence of deep vein thrombosis seen in the left lower extremity,  from the common femoral through the popliteal veins.  - No evidence of superficial venous thrombosis in the left lower  extremity.  - Venous reflux is noted in the left common femoral vein.  - Venous reflux is noted in the left sapheno-femoral junction.      Assessment/Plan: Recurrent varicosities, as well as recurrent edema in the lower extremities.   Venous reflux due to lack of improvement over the years he has returned for exam and repeat study of his B LE.  The venous reflux shows  Right LE : - Venous reflux is noted in the right common femoral vein.  - Venous reflux is noted in the right sapheno-femoral junction.  - Venous reflux is noted in the right greater saphenous vein in the thigh.  - Venous reflux is noted  in the right greater saphenous vein in the calf.  Left LE  - Venous reflux is noted in the left common femoral vein.  - Venous reflux is noted in the left sapheno-femoral junction.   There is recanalization of the right great saphenous vein which is patent from the mid thigh to the saphenofemoral junction is a large vein and has gross reflux.  Left leg has bulging varicosities in the left medial distal thigh extending into the medial calf.  There are multiple spider vains surrounding his ankles as well from chronic venous insufficieny,  He would benefit from stab phlebectomy, laser ablation right great saphenous vein from mid thigh the saphenofemoral junction, left mid thigh to saphenofemoral junction great saphenous vein.      Mosetta Pigeon PA-C [facility] [employer] Office: [phone]  MD in clinic [place]

## 2022-10-04 DIAGNOSIS — I8393 Asymptomatic varicose veins of bilateral lower extremities: Secondary | ICD-10-CM

## 2022-12-06 ENCOUNTER — Ambulatory Visit: Payer: Managed Care, Other (non HMO) | Admitting: Internal Medicine

## 2022-12-06 ENCOUNTER — Encounter: Payer: Self-pay | Admitting: Internal Medicine

## 2022-12-06 VITALS — BP 122/74 | HR 86 | Ht 69.0 in | Wt 276.0 lb

## 2022-12-06 DIAGNOSIS — E119 Type 2 diabetes mellitus without complications: Secondary | ICD-10-CM

## 2022-12-06 DIAGNOSIS — Z7984 Long term (current) use of oral hypoglycemic drugs: Secondary | ICD-10-CM | POA: Diagnosis not present

## 2022-12-06 LAB — POCT GLUCOSE (DEVICE FOR HOME USE): Glucose Fasting, POC: 151 mg/dL — AB (ref 70–99)

## 2022-12-06 LAB — POCT GLYCOSYLATED HEMOGLOBIN (HGB A1C): Hemoglobin A1C: 7.2 % — AB (ref 4.0–5.6)

## 2022-12-06 MED ORDER — METFORMIN HCL 1000 MG PO TABS: 1000.0000 mg | ORAL_TABLET | Freq: Two times a day (BID) | ORAL | 3 refills | Status: AC

## 2022-12-06 MED ORDER — EMPAGLIFLOZIN 25 MG PO TABS: 25.0000 mg | ORAL_TABLET | Freq: Every day | ORAL | 3 refills | Status: AC

## 2022-12-06 NOTE — Progress Notes (Signed)
Name: Richard Mercer  MRN/ DOB: [id number], Jul 23, 1965   Age/ Sex: 57 y.o., male    PCP: Juluis Rainier, MD (Inactive)   Reason for Endocrinology Evaluation: Type 2 Diabetes Mellitus     Date of Initial Endocrinology Visit: 05/30/2022    PATIENT IDENTIFIER: Richard Mercer is a 57 y.o. male with a past medical history of DM, HTN, and dyslipidemia. The patient presented for initial endocrinology clinic visit on 05/30/2022 for consultative assistance with his diabetes management.    HPI: Richard Mercer was    Diagnosed with DM 2015 Prior Medications tried/Intolerance: as listed          Hemoglobin A1c has ranged from 6.7% in 2021, peaking at 7.9% in 2023.   Patient transferred care from [facility]  Last UTI was 2 yrs ago while on Jardiance   SUBJECTIVE:   During the last visit (05/30/2022): A1c 6.8%  Today (12/06/22): Richard Mercer is here for follow-up on diabetes management.  He checks his blood sugars 0 times daily. The patient has not  had hypoglycemic episodes since the last clinic visit.   Weight stable  Denies nausea or vomiting  Denies constipation  or diarrhea  Has left  foot pain which limited exercise for the past 1.5 month, has left foot brace     HOME DIABETES REGIMEN: Metformin 1000 mg twice daily Jardiance 25 mg daily   Statin: yes ACE-I/ARB: Yes    METER DOWNLOAD SUMMARY: n/a   DIABETIC COMPLICATIONS: Microvascular complications:   Denies: CKD, neuropathy  Last eye exam: Completed 2022  Macrovascular complications:   Denies: CAD, PVD, CVA   PAST HISTORY: Past Medical History: No past medical history on file. Past Surgical History:  Past Surgical History:  Procedure Laterality Date   BONE SPUR     RIGHT HEEL   VARICOSE VEIN SURGERY  2008   B/L    Social History:  reports that he quit smoking about 25 years ago. His smoking use included cigarettes. He started smoking about 37 years ago. He uses smokeless tobacco. He reports current  alcohol use. He reports that he does not use drugs. Family History:  Family History  Problem Relation Age of Onset   Diabetes Mother    Hyperlipidemia Mother    Hypertension Mother    Hypertension Father    Hyperlipidemia Father      HOME MEDICATIONS: Allergies as of 12/06/2022   No Known Allergies      Medication List        Accurate as of December 06, 2022  1:37 PM. If you have any questions, ask your nurse or doctor.          Accu-Chek Guide test strip Generic drug: glucose blood 1 each by Other route daily in the afternoon. Use as instructed   atorvastatin 10 MG tablet Commonly known as: LIPITOR Take by mouth.   CENTRUM ADULT 50+ MULTIGUMMIES PO as directed Orally once a day   empagliflozin 25 MG Tabs tablet Commonly known as: Jardiance Take 1 tablet (25 mg total) by mouth daily.   losartan 100 MG tablet Commonly known as: COZAAR Take 100 mg by mouth daily.   metFORMIN 1000 MG tablet Commonly known as: GLUCOPHAGE Take 1 tablet (1,000 mg total) by mouth 2 (two) times daily.   Tylenol 325 MG tablet Generic drug: acetaminophen 1 tablet as needed Orally every 6 hrs         ALLERGIES: No Known Allergies   REVIEW OF SYSTEMS: A  comprehensive ROS was conducted with the patient and is negative except as per HPI     OBJECTIVE:   VITAL SIGNS: BP 122/74 (BP Location: Left Arm, Patient Position: Sitting, Cuff Size: Large)   Pulse 86   Ht 5\' 9"  (1.753 m)   Wt 276 lb (125.2 kg)   SpO2 98%   BMI 40.76 kg/m    PHYSICAL EXAM:  General: Pt appears well and is in NAD  Neck: General: Supple without adenopathy or carotid bruits. Thyroid: Thyroid size normal.  No goiter or nodules appreciated.   Lungs: Clear with good BS bilat with no rales, rhonchi, or wheezes  Heart: RRR   Abdomen:  soft, nontender  Extremities:  Lower extremities - No pretibial edema.  Neuro: MS is good with appropriate affect, pt is alert and Ox3    DM foot exam:  05/30/2022  The skin of the feet is intact without sores or ulcerations. The pedal pulses are 2+ on right and 2+ on left. The sensation is intact to a screening 5.07, 10 gram monofilament bilaterally    DATA REVIEWED:  02/03/2022 BUN/Cr 16/0.820 GFR 104 LDL 70 TG 136 HDL 41     ASSESSMENT / PLAN / RECOMMENDATIONS:   1) Type 2 Diabetes Mellitus, Sub- Optimally controlled, Without complications - Most recent A1c of 7.2 %. Goal A1c < 7.0 %.    -A1c has trended up from 6.8% to 7.2% -Patient with dietary indiscretions due to having multiple celebrations -We did discuss add-on therapy with GLP-1 agonist, we did discuss the risks as well as the benefits -Patient would like to focus on lifestyle changes at this time    MEDICATIONS: Continue metformin 1000 mg twice daily Continue Jardiance 25 mg daily  EDUCATION / INSTRUCTIONS: BG monitoring instructions: Patient is instructed to check his blood sugars 1 times a day. Call [facility] if: BG persistently < 70  I reviewed the Rule of 15 for the treatment of hypoglycemia in detail with the patient. Literature supplied.   2) Diabetic complications:  Eye: Does not have known diabetic retinopathy.  Neuro/ Feet: Does not have known diabetic peripheral neuropathy. Renal: Patient does not have known baseline CKD. He is  on an ACEI/ARB at present.  3) Lipids: Patient is on statin therapy    Follow-up in 6 months  Signed electronically by: Lyndle Herrlich, MD  [facility]  [facility] [address] [place], [place] [address] Phone: [phone] FAX: [phone]   CC: Juluis Rainier, MD (Inactive) [address] [place] [address] Phone: [phone]  Fax: [phone]    Return to Endocrinology clinic as below: Future Appointments  Date Time Provider Department Center  12/21/2022  2:40 PM Maeola Harman, MD [facility]  04/06/2023 10:30  AM Sanel Stemmer, Konrad Dolores, MD [facility] None

## 2022-12-06 NOTE — Patient Instructions (Signed)
Continue Metformin 1000 mg twice daily  Continue Jardiance 25 mg daily     HOW TO TREAT LOW BLOOD SUGARS (Blood sugar LESS THAN 70 MG/DL) Please follow the RULE OF 15 for the treatment of hypoglycemia treatment (when your (blood sugars are less than 70 mg/dL)   STEP 1: Take 15 grams of carbohydrates when your blood sugar is low, which includes:  3-4 GLUCOSE TABS  OR 3-4 OZ OF JUICE OR REGULAR SODA OR ONE TUBE OF GLUCOSE GEL    STEP 2: RECHECK blood sugar in 15 MINUTES STEP 3: If your blood sugar is still low at the 15 minute recheck --> then, go back to STEP 1 and treat AGAIN with another 15 grams of carbohydrates.

## 2022-12-13 ENCOUNTER — Telehealth: Payer: Self-pay | Admitting: Podiatry

## 2022-12-13 NOTE — Telephone Encounter (Signed)
Patient called in regards to being seen with his mother during her appointment. She is set to come in on the 4th of Dec @1030am   as a new pt. He is sched as a established is there anyway for you to move the appointments closer so they may be seen together. He will be coming with her anyway due to the language barrier.   Thanks

## 2022-12-21 ENCOUNTER — Ambulatory Visit (INDEPENDENT_AMBULATORY_CARE_PROVIDER_SITE_OTHER): Payer: Commercial Managed Care - HMO | Admitting: Vascular Surgery

## 2022-12-21 ENCOUNTER — Encounter: Payer: Self-pay | Admitting: Vascular Surgery

## 2022-12-21 VITALS — BP 138/91 | HR 93 | Temp 97.7°F | Ht 69.0 in | Wt 275.1 lb

## 2022-12-21 DIAGNOSIS — I83893 Varicose veins of bilateral lower extremities with other complications: Secondary | ICD-10-CM | POA: Diagnosis not present

## 2022-12-21 NOTE — Progress Notes (Signed)
Patient ID: Richard Mercer, male   DOB: 1966-01-17, 57 y.o.   MRN: [id number]  Reason for Consult: Follow-up and Varicose Veins   Referred by No ref. provider found  Subjective:     HPI:  Richard Mercer is a 57 y.o. male has a history of bilateral lower extremity laser ablations many years ago.  He now has recurrent varicosities more painful on the right than the left but he does have more on the left.  He is compliant with thigh-high compression stockings bilaterally.  He has lost significant weight recently but continues to have pain and swelling to bilateral lower extremities.  He works full-time at a gas station and several variably hotels.  He does not have a history of DVT does not take blood thinners.  Past Medical History:  Diagnosis Date   Peripheral vascular disease (HCC)    Family History  Problem Relation Age of Onset   Diabetes Mother    Hyperlipidemia Mother    Hypertension Mother    Hypertension Father    Hyperlipidemia Father    Past Surgical History:  Procedure Laterality Date   BONE SPUR     RIGHT HEEL   VARICOSE VEIN SURGERY  2008   B/L    Short Social History:  Social History   Tobacco Use   Smoking status: Former    Current packs/day: 0.00    Types: Cigarettes    Start date: 02/19/1985    Quit date: 02/19/1997    Years since quitting: 25.8   Smokeless tobacco: Current    Types: Chew  Substance Use Topics   Alcohol use: Yes    No Known Allergies  Current Outpatient Medications  Medication Sig Dispense Refill   acetaminophen (TYLENOL) 325 MG tablet 1 tablet as needed Orally every 6 hrs     atorvastatin (LIPITOR) 10 MG tablet Take by mouth.     empagliflozin (JARDIANCE) 25 MG TABS tablet Take 1 tablet (25 mg total) by mouth daily. 90 tablet 3   glucose blood (ACCU-CHEK GUIDE) test strip 1 each by Other route daily in the afternoon. Use as instructed 100 each 3   losartan (COZAAR) 100 MG tablet Take 100 mg by mouth daily.     metFORMIN  (GLUCOPHAGE) 1000 MG tablet Take 1 tablet (1,000 mg total) by mouth 2 (two) times daily. 180 tablet 3   Multiple Vitamins-Minerals (CENTRUM ADULT 50+ MULTIGUMMIES PO) as directed Orally once a day     No current facility-administered medications for this visit.    Review of Systems  Constitutional:  Constitutional negative. HENT: HENT negative.  Eyes: Eyes negative.  Cardiovascular: Positive for leg swelling.  GI: Gastrointestinal negative.  Musculoskeletal: Positive for leg pain.  Neurological: Neurological negative. Hematologic: Hematologic/lymphatic negative.  Psychiatric: Psychiatric negative.        Objective:  Objective   Vitals:   12/21/22 1432  BP: (!) 138/91  Pulse: 93  Temp: 97.7 F (36.5 C)  TempSrc: Temporal  SpO2: 97%  Weight: 275 lb 1.6 oz (124.8 kg)  Height: 5\' 9"  (1.753 m)   Body mass index is 40.63 kg/m.  Physical Exam HENT:     Head: Normocephalic.     Nose: Nose normal.  Eyes:     Pupils: Pupils are equal, round, and reactive to light.  Cardiovascular:     Rate and Rhythm: Normal rate.     Pulses: Normal pulses.  Pulmonary:     Effort: Pulmonary effort is normal.  Musculoskeletal:  Right lower leg: Edema present.     Left lower leg: Edema present.  Skin:    General: Skin is warm.     Capillary Refill: Capillary refill takes less than 2 seconds.  Neurological:     General: No focal deficit present.     Mental Status: He is alert.  Psychiatric:        Mood and Affect: Mood normal.          Data: Venous Reflux Times  +--------------+--------+------+----------+------------+-------------------  ----+  RIGHT        Reflux  Reflux  Reflux  Diameter cmsComments                                No       Yes     Time                                         +--------------+--------+------+----------+------------+-------------------  ----+  CFV                   yes  >1 second                                        +--------------+--------+------+----------+------------+-------------------  ----+  FV mid        no                                                            +--------------+--------+------+----------+------------+-------------------  ----+  Popliteal    no                                                            +--------------+--------+------+----------+------------+-------------------  ----+  GSV at SFJ             yes   >500 ms      .895                              +--------------+--------+------+----------+------------+-------------------  ----+  GSV prox thigh         yes   >500 ms      .728                              +--------------+--------+------+----------+------------+-------------------  ----+  GSV mid thigh                                     prior  ablation/stripping        +--------------+--------+------+----------+------------+-------------------  ----+  GSV dist thigh                                    prior                                                                       ablation/stripping        +--------------+--------+------+----------+------------+-------------------  ----+  GSV at knee                                       prior                                                                       ablation/stripping        +--------------+--------+------+----------+------------+-------------------  ----+  GSV prox calf          yes   >500 ms      .404                              +--------------+--------+------+----------+------------+-------------------  ----+  SSV Pop Fossa no                          .260                              +--------------+--------+------+----------+------------+-------------------  ----+  SSV prox calf no                          .481                               +--------------+--------+------+----------+------------+-------------------  ----+     +--------------+--------+------+----------+------------+-------------------  ----+  LEFT         Reflux  Reflux  Reflux  Diameter cmsComments                                No       Yes     Time                                         +--------------+--------+------+----------+------------+-------------------  ----+  CFV                   yes  >1 second                                       +--------------+--------+------+----------+------------+-------------------  ----+  FV mid        no                                                            +--------------+--------+------+----------+------------+-------------------  ----+  Popliteal    no                                                            +--------------+--------+------+----------+------------+-------------------  ----+  GSV at SFJ             yes   >500 ms      1.10                              +--------------+--------+------+----------+------------+-------------------  ----+  GSV prox thighno                          .376                              +--------------+--------+------+----------+------------+-------------------  ----+  GSV mid thigh                                     prior                                                                       ablation/stripping        +--------------+--------+------+----------+------------+-------------------  ----+  GSV dist thigh                                    prior                                                                       ablation/stripping        +--------------+--------+------+----------+------------+-------------------  ----+  GSV at knee                                       prior  ablation/stripping        +--------------+--------+------+----------+------------+-------------------  ----+  GSV prox calf no                          .328                              +--------------+--------+------+----------+------------+-------------------  ----+  SSV Pop Fossa no                          .189                              +--------------+--------+------+----------+------------+-------------------  ----+  SSV prox calf no                          .170                              +--------------+--------+------+----------+------------+-------------------  ----+         Summary:  Right:  - No evidence of deep vein thrombosis seen in the right lower extremity,  from the common femoral through the popliteal veins.  - No evidence of superficial venous thrombosis in the right lower  extremity.  - Venous reflux is noted in the right common femoral vein.  - Venous reflux is noted in the right sapheno-femoral junction.  - Venous reflux is noted in the right greater saphenous vein in the thigh.  - Venous reflux is noted in the right greater saphenous vein in the calf.    Left:  - No evidence of deep vein thrombosis seen in the left lower extremity,  from the common femoral through the popliteal veins.  - No evidence of superficial venous thrombosis in the left lower  extremity.  - Venous reflux is noted in the left common femoral vein.  - Venous reflux is noted in the left sapheno-femoral junction.      Assessment/Plan:     57 year old male here for evaluation of bilateral lower extremity swelling multiple varicosities consistent with C3 venous disease with symptomatic varicose veins.  Patient states that the right side is worse where he does have a large recurrent great saphenous vein which I evaluated at bedside today is within the fascia.  I have discussed great saphenous vein ablation from the mid thigh to the saphenofemoral  junction and he would also need stab lobectomy greater than 20 with 2 vials of sclerotherapy at the level of the ankle and foot.  All of this is to relieve the symptoms of swelling and painful varicosities.  We also discussed that he would benefit from stab phlebectomy for greater than 20 to the left lower extremity although he has no reflux there.  I will have our vein nurse Domenic Moras, RN Can follow him follow-up with him in the near future.     Maeola Harman MD [facility]

## 2023-02-01 ENCOUNTER — Ambulatory Visit: Payer: Managed Care, Other (non HMO) | Admitting: Podiatry

## 2023-02-03 ENCOUNTER — Ambulatory Visit: Payer: Self-pay | Admitting: Podiatry

## 2023-02-03 ENCOUNTER — Encounter: Payer: Self-pay | Admitting: Podiatry

## 2023-02-03 VITALS — Ht 70.0 in | Wt 270.0 lb

## 2023-02-03 DIAGNOSIS — M216X1 Other acquired deformities of right foot: Secondary | ICD-10-CM | POA: Diagnosis not present

## 2023-02-03 DIAGNOSIS — M216X2 Other acquired deformities of left foot: Secondary | ICD-10-CM

## 2023-02-03 DIAGNOSIS — Q666 Other congenital valgus deformities of feet: Secondary | ICD-10-CM

## 2023-02-03 NOTE — Progress Notes (Signed)
  Subjective:  Patient ID: Richard Mercer, male    DOB: 07-04-1965,  MRN: [id number]  Chief Complaint  Patient presents with   Foot Orthotics    Requesting new orthotics - last pair currently still wearing are from 2016   New Patient (Initial Visit)    Est pt 2022 - diabetic - 7.2    58 y.o. male presents with the above complaint.  Patient presents with bilateral flatfoot deformity.  Patient was known to our practice and has been doing well.  His orthotics are starting get worn out he would like to do another pair of orthotics.  He denies any foot and ankle issues.  He would like to discuss next treatment plan.   Review of Systems: Negative except as noted in the HPI. Denies N/V/F/Ch.  Past Medical History:  Diagnosis Date   Peripheral vascular disease (HCC)     Current Outpatient Medications:    oxybutynin (DITROPAN-XL) 5 MG 24 hr tablet, Take 5 mg by mouth daily., Disp: , Rfl:    acetaminophen (TYLENOL) 325 MG tablet, 1 tablet as needed Orally every 6 hrs, Disp: , Rfl:    atorvastatin (LIPITOR) 10 MG tablet, Take by mouth., Disp: , Rfl:    empagliflozin  (JARDIANCE ) 25 MG TABS tablet, Take 1 tablet (25 mg total) by mouth daily., Disp: 90 tablet, Rfl: 3   glucose blood (ACCU-CHEK GUIDE) test strip, 1 each by Other route daily in the afternoon. Use as instructed, Disp: 100 each, Rfl: 3   losartan (COZAAR) 100 MG tablet, Take 100 mg by mouth daily., Disp: , Rfl:    metFORMIN  (GLUCOPHAGE ) 1000 MG tablet, Take 1 tablet (1,000 mg total) by mouth 2 (two) times daily., Disp: 180 tablet, Rfl: 3   Multiple Vitamins-Minerals (CENTRUM ADULT 50+ MULTIGUMMIES PO), as directed Orally once a day, Disp: , Rfl:   Social History   Tobacco Use  Smoking Status Former   Current packs/day: 0.00   Types: Cigarettes   Start date: 02/19/1985   Quit date: 02/19/1997   Years since quitting: 25.9  Smokeless Tobacco Current   Types: Chew    No Known Allergies Objective:  There were no vitals filed for  this visit. Body mass index is 38.74 kg/m. Constitutional Well developed. Well nourished.  Vascular Dorsalis pedis pulses palpable bilaterally. Posterior tibial pulses palpable bilaterally. Capillary refill normal to all digits.  No cyanosis or clubbing noted. Pedal hair growth normal.  Neurologic Normal speech. Oriented to person, place, and time. Epicritic sensation to light touch grossly present bilaterally.  Dermatologic Nails well groomed and normal in appearance. No open wounds. No skin lesions.  Orthopedic: Gait examination shows bilateral pes planovalgus deformity with calcaneovalgus to many toe signs partially but recurred the arch with dorsiflexion of the hallux.  Unable to perform single and double heel raise   Radiographs: None Assessment:   1. Pes planovalgus    Plan:  Patient was evaluated and treated and all questions answered.  Pes planovalgus -I explained to patient the etiology of pes planovalgus and relationship with Planter fasciitis and various treatment options were discussed.  Given patient foot structure in the setting of Planter fasciitis I believe patient will benefit from custom-made orthotics to help control the hindfoot motion support the arch of the foot and take the stress away from plantar fascial.  Patient agrees with the plan like to proceed with orthotics -Patient was casted for orthotics   No follow-ups on file.

## 2023-02-15 ENCOUNTER — Other Ambulatory Visit: Payer: Self-pay

## 2023-02-15 NOTE — Progress Notes (Signed)
Orthotic order placed items to be fit when in  [employer], CFo, CFm

## 2023-03-20 ENCOUNTER — Other Ambulatory Visit: Payer: Self-pay

## 2023-04-06 ENCOUNTER — Ambulatory Visit: Payer: Managed Care, Other (non HMO) | Admitting: Internal Medicine

## 2023-04-12 ENCOUNTER — Other Ambulatory Visit: Payer: Self-pay | Admitting: *Deleted

## 2023-04-12 DIAGNOSIS — I83891 Varicose veins of right lower extremities with other complications: Secondary | ICD-10-CM

## 2023-04-20 ENCOUNTER — Ambulatory Visit: Payer: Self-pay

## 2023-04-20 NOTE — Progress Notes (Signed)
 Patient presents today to pick up custom molded foot orthotics, diagnosed with Pes Planus  by Dr. Allena Katz.   Orthotics were dispensed and fit was satisfactory. Reviewed instructions for break-in and wear. Written instructions given to patient.  Patient will follow up as needed.   Richard Mercer CPed, CFo, CFm

## 2023-06-22 ENCOUNTER — Other Ambulatory Visit: Payer: Self-pay | Admitting: Vascular Surgery

## 2023-07-06 ENCOUNTER — Encounter: Payer: Self-pay | Admitting: Vascular Surgery

## 2023-07-06 ENCOUNTER — Encounter (HOSPITAL_COMMUNITY): Payer: Self-pay

## 2023-07-26 ENCOUNTER — Other Ambulatory Visit: Payer: Self-pay | Admitting: *Deleted

## 2023-07-26 MED ORDER — LORAZEPAM 1 MG PO TABS: ORAL_TABLET | ORAL | 0 refills | Status: AC

## 2023-08-03 ENCOUNTER — Ambulatory Visit: Attending: Vascular Surgery | Admitting: Vascular Surgery

## 2023-08-03 ENCOUNTER — Encounter: Payer: Self-pay | Admitting: Vascular Surgery

## 2023-08-03 VITALS — BP 133/85 | HR 90 | Temp 97.1°F | Resp 18 | Ht 69.0 in | Wt 269.0 lb

## 2023-08-03 DIAGNOSIS — I83891 Varicose veins of right lower extremities with other complications: Secondary | ICD-10-CM | POA: Diagnosis not present

## 2023-08-03 HISTORY — PX: LASER ABLATION: SHX1947

## 2023-08-03 NOTE — Progress Notes (Signed)
     Laser Ablation Procedure    Date: 08/03/2023 Richard Mercer DOB:1965/03/04 Consent signed: Yes     Surgeon: Dr. Penne Colorado Procedure: Laser Ablation: right Greater Saphenous Vein BP 133/85 (BP Location: Left Arm, Patient Position: Sitting, Cuff Size: Large)   Pulse 90   Temp (!) 97.1 F (36.2 C) (Temporal)   Resp 18   Ht 5' 9 (1.753 m)   Wt 269 lb (122 kg)   SpO2 99%   BMI 39.72 kg/m  Tumescent Anesthesia: 675 cc 0.9% NaCl with 50 cc Lidocaine HCL 1%  and 15 cc 8.4% NaHCO3 Local Anesthesia: 35 cc Lidocaine HCL and NaHCO3 (ratio 2:1) 7 watts continuous mode   Total energy: 554.5 joules    Total time: 79 seconds Treatment Length 11 cm Laser Fiber Ref. # [id number]       Lot # [id number] Stab Phlebectomy: >20 Sites: Calf and Ankle  Patient tolerated procedure well Notes:  All staff members wore face mask.  Pt. Had 2 tabs of 1 mg ativan  at 07:30 am prior to surgical procedure   Description of Procedure: After marking the course of the secondary varicosities, the patient was placed on the operating table in the supine position, and the right leg was prepped and draped in sterile fashion.   Local anesthetic was administered and under ultrasound guidance the saphenous vein was accessed with a micro needle and guide wire; then the mirco puncture sheath was placed.  A guide wire was inserted saphenofemoral junction , followed by a 5 french sheath.  The position of the sheath and then the laser fiber below the junction was confirmed using the ultrasound.  Tumescent anesthesia was administered along the course of the saphenous vein using ultrasound guidance. The patient was placed in Trendelenburg position and protective laser glasses were placed on patient and staff, and the laser was fired at 7 watts continuous mode for a total of 554.5 joules.  For stab phlebectomies, local anesthetic was administered at the previously marked varicosities, and tumescent anesthesia was administered around the  vessels.  Greater than 20 stab wounds were made using the tip of an 11 blade. And using the vein hook, the phlebectomies were performed using a hemostat to avulse the varicosities.  Adequate hemostasis was achieved.   Steri strips were applied to the stab wounds and ABD pads and thigh high compression stockings were applied.  Ace wrap bandages were applied over the phlebectomy sites and at the top of the saphenofemoral junction. Blood loss was less than 15 cc.  Discharge instructions reviewed with patient and hardcopy of discharge instructions given to patient to take home. The patient  was wheeled out of the operating room having tolerated the procedure well.

## 2023-08-03 NOTE — Progress Notes (Signed)
 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 male has a history of bilateral lower extremity great saphenous vein ablations now with recurrent varicosities bilaterally with right greater than left symptoms.  He has been compliant with thigh-high compression stockings bilaterally.  He was evaluated in our office and has recurrent greater saphenous vein reflux with swelling and symptomatic varicosities consistent with C3 venous disease.  He presents today for great saphenous vein ablation and stab phlebectomy greater than 20.  Current Outpatient Medications  Medication Sig Dispense Refill   acetaminophen (TYLENOL) 325 MG tablet 1 tablet as needed Orally every 6 hrs     atorvastatin (LIPITOR) 10 MG tablet Take by mouth.     empagliflozin  (JARDIANCE ) 25 MG TABS tablet Take 1 tablet (25 mg total) by mouth daily. 90 tablet 3   glucose blood (ACCU-CHEK GUIDE) test strip 1 each by Other route daily in the afternoon. Use as instructed 100 each 3   LORazepam  (ATIVAN ) 1 MG tablet Take 2 tablets 30 to 60 minutes prior to leaving house on day of office surgery.  Bring third tablet with you to office on day of office surgery. 3 tablet 0   losartan (COZAAR) 100 MG tablet Take 100 mg by mouth daily.     metFORMIN  (GLUCOPHAGE ) 1000 MG tablet Take 1 tablet (1,000 mg total) by mouth 2 (two) times daily. 180 tablet 3   Multiple Vitamins-Minerals (CENTRUM ADULT 50+ MULTIGUMMIES PO) as directed Orally once a day     oxybutynin (DITROPAN-XL) 5 MG 24 hr tablet Take 5 mg by mouth daily.     No current facility-administered medications for this visit.    PHYSICAL EXAM: Vitals:   08/03/23 0858  BP: 133/85  Pulse: 90  Resp: 18  Temp: (!) 97.1 F (36.2 C)  SpO2: 99%    Awake alert and oriented Nonlabored respirations Varicosities of the right lower extremity were  marked with the patient in the standing position  PROCEDURE: Right greater saphenous vein laser ablation totaling 12 cm and stab phlebectomy of right medial thigh, medial and lateral leg for greater than 20 sites  TECHNIQUE: Richard Mercer was taken to the procedure room where he was initially placed standing and varicosities of the right lower extremity were marked at the medial thigh, medial and lateral leg.  He was then placed supine and sterilely prepped and draped in the right lower extremity circumferentially and a timeout was called.  We began using ultrasound to identify the residual greater saphenous vein in the medial thigh and the area was anesthetized with 1% lidocaine and cannulated with a micropuncture needle followed by wire and sheath.  A J-wire was placed 3 cm from the saphenofemoral junction and a laser catheter was placed followed by the laser to a total of 12 cm.  Tumescent anesthesia was injected around the laser.  We then performed laser ablation for a total of 12 cm and at completion the vein was clearly ablated and the common femoral vein remained patent and compressible.  Attention was then turned to the multiple areas of varicosities of the right medial thigh, medial and lateral leg.  The areas were anesthetized and tumescent anesthesia was instilled.  Stab incisions were created the veins were grasped with crochet hook and removed with combination of hemostat and hook.  Steri-Strips and Dermabond were then placed  and a sterile compressive wrap was placed.  He tolerated the procedure well without immediate complication.  Plan will be for follow-up in 2 weeks with postablation right lower extremity duplex and evaluate for treatment of left lower extremity symptomatic varicosities although previous reflux study on the left did not demonstrate any significant residual greater saphenous vein.  [place] [facility] [phone]

## 2023-08-09 ENCOUNTER — Ambulatory Visit: Attending: Vascular Surgery | Admitting: Vascular Surgery

## 2023-08-09 ENCOUNTER — Encounter: Payer: Self-pay | Admitting: Vascular Surgery

## 2023-08-09 ENCOUNTER — Ambulatory Visit (HOSPITAL_COMMUNITY)
Admission: RE | Admit: 2023-08-09 | Discharge: 2023-08-09 | Disposition: A | Discharge status: Home / Self Care | Source: Ambulatory Visit | Attending: Vascular Surgery | Admitting: Vascular Surgery

## 2023-08-09 VITALS — BP 136/88 | HR 78 | Temp 97.9°F | Ht 69.0 in | Wt 272.0 lb

## 2023-08-09 DIAGNOSIS — I83891 Varicose veins of right lower extremities with other complications: Secondary | ICD-10-CM | POA: Diagnosis not present

## 2023-08-09 DIAGNOSIS — I83893 Varicose veins of bilateral lower extremities with other complications: Secondary | ICD-10-CM

## 2023-08-09 NOTE — Progress Notes (Signed)
     Subjective:     Patient ID: Richard Mercer, male   DOB: [birth year], 58 y.o.   MRN: [id number]  HPI 58 year old male status post right greater saphenous ablation and stab phlebectomy.  He is doing well and denies pain.  He does have pain along the left medial thigh where he has multiple varicosities which also radiate down to his lower leg   Review of Systems As above    Objective:   Physical Exam Vitals:   08/09/23 1219  BP: 136/88  Pulse: 78  Temp: 97.9 F (36.6 C)  SpO2: 98%          Venous Reflux Times  +---------+---------+------+-----------+------------+--------+  RIGHT   Reflux NoRefluxReflux TimeDiameter cmsComments                     Yes                                   +---------+---------+------+-----------+------------+--------+  CFV                                           Patent    +---------+---------+------+-----------+------------+--------+  FV mid                                         Patent    +---------+---------+------+-----------+------------+--------+  Popliteal                                     Patent    +---------+---------+------+-----------+------------+--------+        Summary:  Right:  - No evidence of deep vein thrombosis from the common femoral through the  popliteal veins.    - Successful ablation of the right great saphenous vein from the  mid/distal thigh up to approximately 1.88 cm from the SFJ.      Assessment/plan     58 year old male status post above-noted procedure.  He will need stab phlebectomy of multiple symptomatic varicosities of his left lower extremity consistent with C3 venous disease as he also has associated swelling.  He also needs sclerotherapy of his bilateral lower extremities.  He will continue thigh-high compression stockings bilaterally and we will plan for stab phlebectomy greater than 20 of the left lower extremity.  Richard Row, RN met with him today to  discuss sclerotherapy of the right lower extremity.   Richard Mercer C. Sheree, MD [facility] Office: [phone] Pager: [phone]

## 2023-09-13 ENCOUNTER — Ambulatory Visit: Attending: Vascular Surgery

## 2023-09-13 DIAGNOSIS — I83893 Varicose veins of bilateral lower extremities with other complications: Secondary | ICD-10-CM | POA: Diagnosis not present

## 2023-09-13 NOTE — Progress Notes (Signed)
 Treated pt's RLE spider and small reticular veins with Asclera 1%. Pt is healing well s/p laser ablation with stab phlebectomies. He received a total of 4 mL/40 mg of Asclera 1%, administered with a 27 gauge butterfly needle. He tolerated well. Had a lot of areas to treat, so we did all we could with the 2 approved units. He was placed in 20-30 mm Hg thigh high compression hose at the end of his treatment and given post treatment care instructions. He will call us  with any questions/concerns.

## 2023-09-21 DIAGNOSIS — Z860101 Personal history of adenomatous and serrated colon polyps: Secondary | ICD-10-CM | POA: Diagnosis not present

## 2023-09-21 DIAGNOSIS — K5730 Diverticulosis of large intestine without perforation or abscess without bleeding: Secondary | ICD-10-CM | POA: Diagnosis not present

## 2023-09-21 DIAGNOSIS — Z09 Encounter for follow-up examination after completed treatment for conditions other than malignant neoplasm: Secondary | ICD-10-CM | POA: Diagnosis not present

## 2023-09-21 DIAGNOSIS — K621 Rectal polyp: Secondary | ICD-10-CM | POA: Diagnosis not present

## 2023-09-21 DIAGNOSIS — D124 Benign neoplasm of descending colon: Secondary | ICD-10-CM | POA: Diagnosis not present

## 2023-11-01 ENCOUNTER — Other Ambulatory Visit: Payer: Self-pay | Admitting: *Deleted

## 2023-11-01 MED ORDER — LORAZEPAM 1 MG PO TABS: ORAL_TABLET | ORAL | 0 refills | Status: AC

## 2023-11-08 ENCOUNTER — Telehealth: Payer: Self-pay | Admitting: *Deleted

## 2023-11-08 NOTE — Telephone Encounter (Signed)
 Returned Mr. Richard Mercer's call regarding his appointment on 11-09-2023 for stab phlebectomy procedure with Dr. Sheree.  Mr. Richard Mercer states he awoke last night with a tooth ache and chills which were relieved by Tylenol.  States he has not had pain today and wants to proceed with stab phlebectomy procedure on 11-09-2023.  Advised him to call his dentist if symptoms reoccur.  Advised him to call [facility] if fever, chills, or if tooth pain reoccurs.  Notified Dr. Sheree of Mr Richard Mercer's symptoms.

## 2023-11-09 ENCOUNTER — Ambulatory Visit: Attending: Vascular Surgery | Admitting: Vascular Surgery

## 2023-11-09 ENCOUNTER — Encounter: Payer: Self-pay | Admitting: Vascular Surgery

## 2023-11-09 VITALS — BP 128/84 | HR 87 | Temp 97.8°F | Resp 16 | Ht 70.0 in | Wt 265.0 lb

## 2023-11-09 DIAGNOSIS — I83892 Varicose veins of left lower extremities with other complications: Secondary | ICD-10-CM | POA: Diagnosis not present

## 2023-11-09 HISTORY — PX: VEIN SURGERY: SHX48

## 2023-11-09 NOTE — Progress Notes (Signed)
      Patient name: Richard Mercer MRN: [id number] DOB: August 28, 1965 Sex: male  REASON FOR VISIT: Treatment left lower extremity symptomatic varicosities  HPI: Richard Mercer is a 58 y.o. male history of right greater saphenous vein ablation and stab phlebectomy now recovering well status post sclerotherapy on the right.  He also has painful varicosities of the left medial thigh radiating down the left lower leg for which he presents for treatment today.  Current Outpatient Medications  Medication Sig Dispense Refill   acetaminophen (TYLENOL) 325 MG tablet 1 tablet as needed Orally every 6 hrs     atorvastatin (LIPITOR) 10 MG tablet Take by mouth.     empagliflozin  (JARDIANCE ) 25 MG TABS tablet Take 1 tablet (25 mg total) by mouth daily. 90 tablet 3   glucose blood (ACCU-CHEK GUIDE) test strip 1 each by Other route daily in the afternoon. Use as instructed 100 each 3   LORazepam  (ATIVAN ) 1 MG tablet Take 2 tablets 30 to 60 minutes prior to leaving house on day of office surgery.  Bring third tablet with you to office on day of office surgery. 3 tablet 0   LORazepam  (ATIVAN ) 1 MG tablet Take 2 tablets 30 to 60 minutes prior to leaving house on day of office surgery.  Bring third tablet with you to office on day of office surgery. 3 tablet 0   losartan (COZAAR) 100 MG tablet Take 100 mg by mouth daily.     metFORMIN  (GLUCOPHAGE ) 1000 MG tablet Take 1 tablet (1,000 mg total) by mouth 2 (two) times daily. 180 tablet 3   Multiple Vitamins-Minerals (CENTRUM ADULT 50+ MULTIGUMMIES PO) as directed Orally once a day     oxybutynin (DITROPAN-XL) 5 MG 24 hr tablet Take 5 mg by mouth daily.     No current facility-administered medications for this visit.    PHYSICAL EXAM: Vitals:   11/09/23 0857  BP: 128/84  Pulse: 87  Resp: 16  Temp: 97.8 F (36.6 C)  SpO2: 97%    Awake alert and oriented Nonlabored respirations Varicosities of left lower extremity marked with the patient  standing  PROCEDURE: Left medial thigh, medial anterior and lateral leg stab phlebectomy greater than 20 sites  TECHNIQUE: [name] was taken the procedure room where he was initially evaluated standing in the varicosities of the left lower extremity focused on the medial thigh, medial anterior lateral leg were all marked.  He was then laid supine and sterilely prepped and draped in the left lower extremity circumferentially and a timeout was called.  We began anesthetizing the multiple areas of varicosities and instilling tumescent anesthesia.  Greater than 20 stab phlebectomy sites were created overlying the varicosities and these were removed using combination of crochet hook and hemostat.  At completion Steri-Strips were placed followed by a compressive wrap.  He tolerated the procedure without any complication.  Patient does not require specific follow-up unless he is having postoperative issues.  He will require sclerotherapy.  [place] [facility] [phone]

## 2023-11-09 NOTE — Progress Notes (Signed)
     Laser Ablation Procedure    Date: 11/09/2023 Richard Mercer DOB:12-31-1965 Consent signed: Yes     Surgeon: Dr. [place]ne [place] Procedure: Left leg >20 stab phlebectomies  BP 128/84 (BP Location: Right Arm, Patient Position: Sitting, Cuff Size: Large)   Pulse 87   Temp 97.8 F (36.6 C) (Temporal)   Resp 16   Ht 5' 10 (1.778 m)   Wt 265 lb (120.2 kg)   SpO2 97%   BMI 38.02 kg/m   Tumescent Anesthesia: 480 cc 0.9% NaCl with 50 cc Lidocaine HCL 1%  and 15 cc 8.4% NaHCO3 Local Anesthesia: 15 cc Lidocaine HCL and NaHCO3 (ratio 2:1)   Stab Phlebectomy: >20 Sites: Thigh and Calf Patient tolerated procedure well Notes: All staff members wore facial masks .Pt. Had 2 mg of ativan   at 7:40 am prior to the surgical procedure  Description of Procedure:  stab phlebectomies, local anesthetic was administered at the previously marked varicosities, and tumescent anesthesia was administered around the vessels.  Greater than 20 stab wounds were made using the tip of an 11 blade. And using the vein hook, the phlebectomies were performed using a hemostat to avulse the varicosities.  Adequate hemostasis was achieved.   Steri strips were applied to the stab wounds and ABD pads and thigh high compression stockings were applied.  Ace wrap bandages were applied over the phlebectomy sites and at the top of the saphenofemoral junction. Blood loss was less than 15 cc.  Discharge instructions reviewed with patient and hardcopy of discharge instructions given to patient to take home. The patient was wheeled out of the operating room having tolerated the procedure well.

## 2023-12-04 DIAGNOSIS — R3915 Urgency of urination: Secondary | ICD-10-CM | POA: Diagnosis not present

## 2023-12-04 DIAGNOSIS — N481 Balanitis: Secondary | ICD-10-CM | POA: Diagnosis not present

## 2023-12-04 DIAGNOSIS — N401 Enlarged prostate with lower urinary tract symptoms: Secondary | ICD-10-CM | POA: Diagnosis not present

## 2023-12-22 ENCOUNTER — Other Ambulatory Visit: Payer: Self-pay | Admitting: Internal Medicine

## 2024-01-03 ENCOUNTER — Ambulatory Visit: Attending: Surgery

## 2024-01-03 DIAGNOSIS — I83893 Varicose veins of bilateral lower extremities with other complications: Secondary | ICD-10-CM

## 2024-01-03 NOTE — Progress Notes (Signed)
 Treated pt's BLE spider and reticular veins with Asclera 1%. Pt received a total of 6 ml/60 mg of Asclera 1% administered with a 27 gauge butterfly needle. Pt tolerated well. He was given post treatment care instructions on handout and verbally. At the end of treatment, he was placed in 20-30 mm Hg thigh high compression hose. He will follow up as needed.
